# Patient Record
Sex: Female | Born: 1960 | Race: Black or African American | Hispanic: No | Marital: Single | Smoking: Current every day smoker
Health system: Southern US, Community
[De-identification: ages and names within clinical notes are randomized; demographics above are authoritative.]

## PROBLEM LIST (undated history)

## (undated) DIAGNOSIS — M1990 Unspecified osteoarthritis, unspecified site: Secondary | ICD-10-CM

## (undated) DIAGNOSIS — F329 Major depressive disorder, single episode, unspecified: Secondary | ICD-10-CM

## (undated) DIAGNOSIS — F32A Depression, unspecified: Secondary | ICD-10-CM

## (undated) DIAGNOSIS — I2510 Atherosclerotic heart disease of native coronary artery without angina pectoris: Secondary | ICD-10-CM

## (undated) DIAGNOSIS — J45909 Unspecified asthma, uncomplicated: Secondary | ICD-10-CM

## (undated) DIAGNOSIS — K759 Inflammatory liver disease, unspecified: Secondary | ICD-10-CM

## (undated) DIAGNOSIS — I219 Acute myocardial infarction, unspecified: Secondary | ICD-10-CM

## (undated) DIAGNOSIS — E559 Vitamin D deficiency, unspecified: Secondary | ICD-10-CM

## (undated) HISTORY — DX: Inflammatory liver disease, unspecified: K75.9

## (undated) HISTORY — DX: Atherosclerotic heart disease of native coronary artery without angina pectoris: I25.10

## (undated) HISTORY — PX: KNEE SURGERY: SHX244

## (undated) HISTORY — PX: ANKLE SURGERY: SHX546

## (undated) HISTORY — DX: Unspecified asthma, uncomplicated: J45.909

## (undated) HISTORY — DX: Acute myocardial infarction, unspecified: I21.9

## (undated) HISTORY — DX: Vitamin D deficiency, unspecified: E55.9

## (undated) HISTORY — DX: Unspecified osteoarthritis, unspecified site: M19.90

## (undated) HISTORY — PX: TUBAL LIGATION: SHX77

---
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
## 2004-07-21 ENCOUNTER — Emergency Department: Payer: Self-pay | Admitting: Emergency Medicine

## 2004-10-20 ENCOUNTER — Emergency Department: Payer: Self-pay | Admitting: Emergency Medicine

## 2004-12-04 ENCOUNTER — Other Ambulatory Visit: Payer: Self-pay

## 2004-12-04 ENCOUNTER — Emergency Department: Payer: Self-pay | Admitting: Emergency Medicine

## 2005-04-30 ENCOUNTER — Emergency Department: Payer: Self-pay | Admitting: General Practice

## 2007-01-31 ENCOUNTER — Emergency Department: Payer: Self-pay | Admitting: Emergency Medicine

## 2007-02-05 ENCOUNTER — Inpatient Hospital Stay: Payer: Self-pay | Admitting: Internal Medicine

## 2007-02-05 ENCOUNTER — Other Ambulatory Visit: Payer: Self-pay

## 2009-06-18 ENCOUNTER — Emergency Department: Payer: Self-pay | Admitting: Internal Medicine

## 2009-10-16 ENCOUNTER — Inpatient Hospital Stay: Payer: Self-pay | Admitting: Specialist

## 2009-10-21 ENCOUNTER — Emergency Department: Payer: Self-pay | Admitting: Internal Medicine

## 2011-01-06 ENCOUNTER — Emergency Department: Payer: Self-pay | Admitting: Internal Medicine

## 2011-11-09 ENCOUNTER — Emergency Department: Payer: Self-pay | Admitting: Emergency Medicine

## 2012-03-20 ENCOUNTER — Emergency Department: Payer: Self-pay | Admitting: Emergency Medicine

## 2012-03-20 LAB — COMPREHENSIVE METABOLIC PANEL
Anion Gap: 12 (ref 7–16)
Bilirubin,Total: 0.5 mg/dL (ref 0.2–1.0)
Chloride: 109 mmol/L — ABNORMAL HIGH (ref 98–107)
Co2: 22 mmol/L (ref 21–32)
Creatinine: 0.99 mg/dL (ref 0.60–1.30)
EGFR (Non-African Amer.): >60
Glucose: 79 mg/dL (ref 65–99)
Osmolality: 284 (ref 275–301)
Potassium: 4.2 mmol/L (ref 3.5–5.1)
SGOT(AST): 51 U/L — ABNORMAL HIGH (ref 15–37)
SGPT (ALT): 51 U/L
Sodium: 143 mmol/L (ref 136–145)
Total Protein: 8.6 g/dL — ABNORMAL HIGH (ref 6.4–8.2)

## 2012-03-20 LAB — CBC WITH DIFFERENTIAL/PLATELET
Bands: 1 %
Comment - H1-Com1: NORMAL
Comment - H1-Com2: NORMAL
HCT: 43.8 % (ref 35.0–47.0)
HGB: 14.6 g/dL (ref 12.0–16.0)
MCH: 32.6 pg (ref 26.0–34.0)
MCHC: 33.3 g/dL (ref 32.0–36.0)
Platelet: 203 10*3/uL (ref 150–440)
RDW: 13.4 % (ref 11.5–14.5)
WBC: 5.9 10*3/uL (ref 3.6–11.0)

## 2012-03-20 LAB — TROPONIN I: Troponin-I: <0.02 ng/mL

## 2012-07-08 ENCOUNTER — Emergency Department: Payer: Self-pay | Admitting: Emergency Medicine

## 2013-01-23 ENCOUNTER — Emergency Department: Payer: Self-pay | Admitting: Emergency Medicine

## 2013-02-02 ENCOUNTER — Emergency Department: Payer: Self-pay | Admitting: Emergency Medicine

## 2013-07-05 ENCOUNTER — Emergency Department: Payer: Self-pay | Admitting: Internal Medicine

## 2014-03-09 ENCOUNTER — Emergency Department: Payer: Self-pay | Admitting: Emergency Medicine

## 2014-03-09 LAB — COMPREHENSIVE METABOLIC PANEL
ALT: 44 U/L (ref 12–78)
Albumin: 3.6 g/dL (ref 3.4–5.0)
Alkaline Phosphatase: 98 U/L
Anion Gap: 3 — ABNORMAL LOW (ref 7–16)
BILIRUBIN TOTAL: 0.4 mg/dL (ref 0.2–1.0)
BUN: 10 mg/dL (ref 7–18)
CHLORIDE: 110 mmol/L — AB (ref 98–107)
CO2: 26 mmol/L (ref 21–32)
Calcium, Total: 8.9 mg/dL (ref 8.5–10.1)
Creatinine: 0.84 mg/dL (ref 0.60–1.30)
Glucose: 91 mg/dL (ref 65–99)
Osmolality: 276 (ref 275–301)
Potassium: 4 mmol/L (ref 3.5–5.1)
SGOT(AST): 46 U/L — ABNORMAL HIGH (ref 15–37)
Sodium: 139 mmol/L (ref 136–145)
TOTAL PROTEIN: 7.8 g/dL (ref 6.4–8.2)

## 2014-03-09 LAB — CBC
HCT: 42.7 % (ref 35.0–47.0)
HGB: 14.4 g/dL (ref 12.0–16.0)
MCH: 32.8 pg (ref 26.0–34.0)
MCHC: 33.6 g/dL (ref 32.0–36.0)
MCV: 98 fL (ref 80–100)
Platelet: 189 10*3/uL (ref 150–440)
RBC: 4.37 10*6/uL (ref 3.80–5.20)
RDW: 13.1 % (ref 11.5–14.5)
WBC: 5.8 10*3/uL (ref 3.6–11.0)

## 2014-03-09 LAB — TROPONIN I: Troponin-I: <0.02 ng/mL

## 2014-10-08 ENCOUNTER — Emergency Department: Payer: Self-pay | Admitting: Emergency Medicine

## 2014-11-13 ENCOUNTER — Emergency Department: Payer: Self-pay | Admitting: Emergency Medicine

## 2014-11-23 ENCOUNTER — Ambulatory Visit: Payer: Self-pay | Admitting: Family Medicine

## 2014-11-24 ENCOUNTER — Encounter (INDEPENDENT_AMBULATORY_CARE_PROVIDER_SITE_OTHER): Payer: Self-pay

## 2014-11-24 ENCOUNTER — Encounter: Payer: Self-pay | Admitting: Cardiovascular Disease

## 2014-11-24 ENCOUNTER — Ambulatory Visit (INDEPENDENT_AMBULATORY_CARE_PROVIDER_SITE_OTHER): Payer: Medicaid Other | Admitting: Cardiovascular Disease

## 2014-11-24 VITALS — BP 104/62 | HR 76 | Ht 66.0 in | Wt 135.8 lb

## 2014-11-24 DIAGNOSIS — Z720 Tobacco use: Secondary | ICD-10-CM

## 2014-11-24 DIAGNOSIS — F1910 Other psychoactive substance abuse, uncomplicated: Secondary | ICD-10-CM

## 2014-11-24 DIAGNOSIS — R0602 Shortness of breath: Secondary | ICD-10-CM

## 2014-11-24 DIAGNOSIS — F1911 Other psychoactive substance abuse, in remission: Secondary | ICD-10-CM | POA: Insufficient documentation

## 2014-11-24 DIAGNOSIS — R600 Localized edema: Secondary | ICD-10-CM

## 2014-11-24 DIAGNOSIS — J4540 Moderate persistent asthma, uncomplicated: Secondary | ICD-10-CM | POA: Insufficient documentation

## 2014-11-24 DIAGNOSIS — F17200 Nicotine dependence, unspecified, uncomplicated: Secondary | ICD-10-CM | POA: Insufficient documentation

## 2014-11-24 NOTE — Assessment & Plan Note (Signed)
Chronic asthma, dependent on inhalers. Likely exacerbated by her smoking

## 2014-11-24 NOTE — Assessment & Plan Note (Signed)
She reports having some ankle swelling at the end of the day. Minimal swelling noted on today's visit. Good extremity pulses

## 2014-11-24 NOTE — Progress Notes (Signed)
Patient ID: Connie Moreno Moreno, female    DOB: 05/04/1961, 54 y.o.   MRN: [id number]  HPI Comments: Connie Moreno Moreno is a very pleasant 54 year old woman with history of asthma, long smoking history from age 54 who continues to smoke one half pack per day, chronic nerve pain in her legs, bilateral osteoarthritis, recent cortisone shot to her right knee with improvement of her symptoms, who presents for evaluation of shortness of breath, arm pain. Notes indicate a history of prior cocaine use when she was 22, possible period of incarceration at that time.  In follow-up today, she reports that her breathing seems to come and go. When she has asthma attack, she takes 4 puffs of her inhaler.  She denies any significant chest pain. Occasionally has right arm pain. She states that she is going to quit smoking on February 22, in 2 weeks' time, when she follows up with primary care. She cannot quit now as she recently bought a pack of cigarettes. She is concerned about flow in her legs as she continues to have sharp pain down her leg to her feet. No claudication type pain She is concerned about lower extremity edema. After being on her feet all day, has some very mild ankle swelling  EKG on today's visit shows normal sinus rhythm with rate 76 bpm, no significant ST or T-wave changes     Allergies no known allergies  Outpatient Encounter Prescriptions as of 11/24/2014  Medication Sig  . albuterol (PROVENTIL HFA;VENTOLIN HFA) 108 (90 BASE) MCG/ACT inhaler Inhale 2 puffs into the lungs every 6 (six) hours as needed.   . beclomethasone (QVAR) 80 MCG/ACT inhaler Inhale 2 puffs into the lungs 2 (two) times daily.   . Cholecalciferol (VITAMIN D) 2000 UNITS CAPS Take 2,000 Units by mouth daily.   . meloxicam (MOBIC) 15 MG tablet Take 15 mg by mouth daily.  . sertraline (ZOLOFT) 50 MG tablet Take 50 mg by mouth daily.   . traMADol (ULTRAM) 50 MG tablet Take 50 mg by mouth every 6 (six) hours as needed.  .  [DISCONTINUED] meloxicam (MOBIC) 15 MG tablet Take by mouth.    Past Medical History  Diagnosis Date  . MI (myocardial infarction)     age in her mid 4120's  . Asthma   . Coronary artery disease   . Hepatitis   . Arthritis   . Vitamin D deficiency     Past Surgical History  Procedure Laterality Date  . Ankle surgery      metal plate and screws in right ankle.   . Knee surgery      bilateral knee surgery     Social History  reports that she has been smoking Cigarettes.  She has a 19.5 pack-year smoking history. She does not have any smokeless tobacco history on file. She reports that she drinks alcohol. She reports that she uses illicit drugs (Marijuana and Cocaine).  Family History family history includes Hypertension in her brother and brother.   Review of Systems  Constitutional: Negative.   Respiratory: Positive for shortness of breath.   Cardiovascular: Negative.   Gastrointestinal: Negative.   Musculoskeletal: Positive for arthralgias.  Skin: Negative.   Neurological: Negative.   Hematological: Negative.   Psychiatric/Behavioral: Negative.   All other systems reviewed and are negative.   BP 104/62 mmHg  Pulse 76  Ht 5\' 6"  (1.676 m)  Wt 135 lb 12 oz (61.576 kg)  BMI 21.92 kg/m2  Physical Exam  Constitutional: She is oriented  to person, place, and time. She appears well-developed and well-nourished.  HENT:  Head: Normocephalic.  Nose: Nose normal.  Mouth/Throat: Oropharynx is clear and moist.  Eyes: Conjunctivae are normal. Pupils are equal, round, and reactive to light.  Neck: Normal range of motion. Neck supple. No JVD present.  Cardiovascular: Normal rate, regular rhythm, S1 normal, S2 normal, normal heart sounds and intact distal pulses.  Exam reveals no gallop and no friction rub.   No murmur heard. Pulmonary/Chest: Effort normal and breath sounds normal. No respiratory distress. She has no wheezes. She has no rales. She exhibits no tenderness.   Abdominal: Soft. Bowel sounds are normal. She exhibits no distension. There is no tenderness.  Musculoskeletal: Normal range of motion. She exhibits no edema or tenderness.  Lymphadenopathy:    She has no cervical adenopathy.  Neurological: She is alert and oriented to person, place, and time. Coordination normal.  Skin: Skin is warm and dry. No rash noted. No erythema.  Psychiatric: She has a normal mood and affect. Her behavior is normal. Judgment and thought content normal.    Assessment and Plan  Nursing note and vitals reviewed.

## 2014-11-24 NOTE — Patient Instructions (Signed)
You are doing well. No medication changes were made.  We will schedule an echocardiogram for shortness of breath, arm pain  Please call us if you have new issues that need to be addressed before your next appt.  Your physician wants you to follow-up in: 12 months.  You will receive a reminder letter in the mail two months in advance. If you don't receive a letter, please call our office to schedule the follow-up appointment.

## 2014-11-24 NOTE — Assessment & Plan Note (Signed)
Prior cocaine per the notes. She denies any recent substance abuse

## 2014-11-24 NOTE — Assessment & Plan Note (Signed)
She continues to smoke one half pack per day.  We have encouraged her to continue to work on weaning her cigarettes and smoking cessation. She will continue to work on this and does not want any assistance with chantix.

## 2014-11-24 NOTE — Assessment & Plan Note (Addendum)
Etiology of her shortness of breath likely from underlying asthma and long history of smoking. Echocardiogram has been ordered given her leg edema. Normal EKG. Discussed prior history with her. No findings on clinical exam or EKG concerning for old MI. Echocardiogram to rule out wall motion abnormality

## 2014-12-12 ENCOUNTER — Other Ambulatory Visit (INDEPENDENT_AMBULATORY_CARE_PROVIDER_SITE_OTHER): Payer: Medicaid Other

## 2014-12-12 ENCOUNTER — Other Ambulatory Visit: Payer: Self-pay

## 2014-12-12 DIAGNOSIS — R600 Localized edema: Secondary | ICD-10-CM

## 2014-12-12 DIAGNOSIS — R0602 Shortness of breath: Secondary | ICD-10-CM

## 2015-01-03 ENCOUNTER — Ambulatory Visit: Payer: Self-pay | Admitting: Family Medicine

## 2015-01-26 ENCOUNTER — Ambulatory Visit
Admit: 2015-01-26 | Disposition: A | Discharge status: Home / Self Care | Payer: Self-pay | Attending: Family Medicine | Admitting: Family Medicine

## 2015-02-15 ENCOUNTER — Other Ambulatory Visit: Payer: Self-pay | Admitting: Urgent Care

## 2015-02-15 ENCOUNTER — Other Ambulatory Visit
Admit: 2015-02-15 | Disposition: A | Discharge status: Home / Self Care | Payer: Self-pay | Attending: Urgent Care | Admitting: Urgent Care

## 2015-02-15 DIAGNOSIS — R1310 Dysphagia, unspecified: Secondary | ICD-10-CM

## 2015-02-15 LAB — ETHANOL

## 2015-02-19 ENCOUNTER — Ambulatory Visit: Admission: RE | Admit: 2015-02-19 | Payer: Medicaid Other | Source: Ambulatory Visit

## 2015-07-21 ENCOUNTER — Encounter: Payer: Self-pay | Admitting: Emergency Medicine

## 2015-07-21 ENCOUNTER — Emergency Department: Payer: Medicaid Other

## 2015-07-21 ENCOUNTER — Emergency Department
Admission: EM | Admit: 2015-07-21 | Discharge: 2015-07-21 | Disposition: A | Discharge status: Home / Self Care | Payer: Medicaid Other | Attending: Emergency Medicine | Admitting: Emergency Medicine

## 2015-07-21 DIAGNOSIS — Z720 Tobacco use: Secondary | ICD-10-CM | POA: Diagnosis not present

## 2015-07-21 DIAGNOSIS — Z79899 Other long term (current) drug therapy: Secondary | ICD-10-CM | POA: Insufficient documentation

## 2015-07-21 DIAGNOSIS — M1711 Unilateral primary osteoarthritis, right knee: Secondary | ICD-10-CM | POA: Insufficient documentation

## 2015-07-21 DIAGNOSIS — M25561 Pain in right knee: Secondary | ICD-10-CM | POA: Diagnosis present

## 2015-07-21 MED ORDER — MELOXICAM 15 MG PO TABS: 15.0000 mg | ORAL_TABLET | Freq: Every day | ORAL | Status: DC

## 2015-07-21 MED ORDER — NAPROXEN 500 MG PO TABS
500.0000 mg | ORAL_TABLET | Freq: Once | ORAL | Status: AC
  Administered 2015-07-21: 500 mg via ORAL

## 2015-07-21 NOTE — ED Provider Notes (Signed)
[facility] Emergency Department Provider Note  ____________________________________________  Time seen: Approximately 9:59 AM  I have reviewed the triage vital signs and the nursing notes.   HISTORY  Chief Complaint Knee Pain    HPI Connie Moreno is a 54 y.o. female presents to the emergency department complaining of the sudden onset of swelling and pain to her right knee. She states that symptoms began last night. She denies any kind of injury including twisting, fall, blunt trauma. She states that she tried using an ice pack last night without relief. The patient was resting comfortably until provider began interview. Patient became very tearful to the point that further interview was not possible.   Past Medical History  Diagnosis Date  . MI (myocardial infarction) [facility])     age in her mid 43's  . Asthma   . Coronary artery disease   . Hepatitis   . Arthritis   . Vitamin D deficiency     Patient Active Problem List   Diagnosis Date Noted  . Smoker 11/24/2014  . SOB (shortness of breath) 11/24/2014  . Asthma, moderate persistent 11/24/2014  . 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 Bilateral leg edema 11/24/2014    Past Surgical History  Procedure Laterality Date  . Ankle surgery      metal plate and screws in right ankle.   . Knee surgery      bilateral knee surgery     Current Outpatient Rx  Name  Route  Sig  Dispense  Refill  . albuterol (PROVENTIL HFA;VENTOLIN HFA) 108 (90 BASE) MCG/ACT inhaler   Inhalation   Inhale 2 puffs into the lungs every 6 (six) hours as needed.          . beclomethasone (QVAR) 80 MCG/ACT inhaler   Inhalation   Inhale 2 puffs into the lungs 2 (two) times daily.          . Cholecalciferol (VITAMIN D) 2000 UNITS CAPS   Oral   Take 2,000 Units by mouth daily.          . meloxicam (MOBIC) 15 MG tablet   Oral   Take 15 mg by mouth daily.         . sertraline (ZOLOFT) 50 MG tablet   Oral   Take 50 mg by mouth daily.          . traMADol (ULTRAM) 50 MG tablet   Oral   Take 50 mg by mouth every 6 (six) hours as needed.           Allergies Review of patient's allergies indicates no known allergies.  Family History  Problem Relation Age of Onset  . Hypertension Brother   . Hypertension Brother     Social History Social History  Substance Use Topics  . Smoking status: Current Every Day Smoker -- 0.50 packs/day for 39 years    Types: Cigarettes  . Smokeless tobacco: None  . Alcohol Use: Yes     Comment: Drinks a 40 oz. daily.     Review of Systems Constitutional: No fever/chills Eyes: No visual changes. ENT: No sore throat. Cardiovascular: Denies chest pain. Respiratory: Denies shortness of breath. Gastrointestinal: No abdominal pain.  No nausea, no vomiting.  No diarrhea.  No constipation. Genitourinary: Negative for dysuria. Musculoskeletal: Negative for back pain. Positive for right knee pain and swelling. Skin: Negative for rash. Neurological: Negative for headaches, focal weakness or numbness.  10-point ROS otherwise negative.  ____________________________________________   PHYSICAL EXAM:  VITAL SIGNS: ED Triage Vitals  Enc Vitals Group     BP 07/21/15 0937 128/105 mmHg     Pulse Rate 07/21/15 0937 84     Resp --      Temp 07/21/15 0937 98.2 F (36.8 C)     Temp Source 07/21/15 0937 Oral     SpO2 07/21/15 0937 99 %     Weight 07/21/15 0937 147 lb (66.679 kg)     Height 07/21/15 0937  (1.651 m)     Head Cir --      Peak Flow --      Pain Score 07/21/15 0928 10     Pain Loc --      Pain Edu? --      Excl. in GC? --     Constitutional: Alert and oriented. Well appearing and in no acute distress. Eyes: Conjunctivae are normal. PERRL. EOMI. Head: Atraumatic. Nose: No congestion/rhinnorhea. Mouth/Throat: Mucous membranes are moist.  Oropharynx non-erythematous. Neck: No stridor.   Hematological/Lymphatic/Immunilogical: No  cervical lymphadenopathy. Cardiovascular: Normal rate, regular rhythm. Grossly normal heart sounds.  Good peripheral circulation. Respiratory: Normal respiratory effort.  No retractions. Lungs CTAB. Gastrointestinal: Soft and nontender. No distention. No abdominal bruits. No CVA tenderness. Musculoskeletal: Physical examination reveals edema to the right knee when compared with the left. No visible ecchymosis, contusion, laceration, or abrasion noted. Exam of right hip and right ankle are without abnormality. Patient endorses tenderness to palpation over all surfaces of the knee. No ballottement palpated. Patient will not allow deep palpation of the joint line or other osseous structures. Patient will not comply with special tests on the knee. Every attempt at a special tests the patient jerked her light away and pointedly refused joint manipulation performed. Neurologic:  Normal speech and language. No gross focal neurologic deficits are appreciated. No gait instability. Skin:  Skin is warm, dry and intact. No rash noted. Psychiatric: Mood and affect are normal. Speech and behavior are normal.  ____________________________________________   LABS (all labs ordered are listed, but only abnormal results are displayed)  Labs Reviewed - No data to display ____________________________________________  EKG   ____________________________________________  RADIOLOGY  Complete right knee x-ray Impression: Osteoarthritic change, most markedly laterally. Small joint effusion. No acute fracture dislocation. The appearance is essentially stable compared to prior study. ____________________________________________   PROCEDURES  Procedure(s) performed: None  Critical Care performed: No  ____________________________________________   INITIAL IMPRESSION / ASSESSMENT AND PLAN / ED COURSE  Pertinent labs & imaging results that were available during my care of the patient were reviewed by me and  considered in my medical decision making (see chart for details).  The patient is a 54 year old female who presents to the emergency department complaining of pain and swelling to her right knee times one day. Initially patient was very upset and crying profusely to the point that information was unable to be obtained during interview. Patient was endorsing tenderness to palpation over the entire aspect of both anterior and posterior right knee. X-ray was obtained which revealed osteoarthritic changes. The x-ray was stable compared to the prior study. X-ray did reveal small effusion but was unable to be palpated from the external surfaces. No ballottement was noted. Patient was a little bit more calm during second interview phase with provider giving her results of her x-ray. At that time patient endorses a history of right knee arthritis which she is being treated by Dr. Deeann Saint on a routine basis. Advised that she is supposed to  have cortisone injections every 3 months with last injection being "5-6 months ago." We'll start patient on meloxicam and gave patient instructions to follow-up with Dr. Hyacinth Meeker should this medication not improve symptoms. Last understanding of diagnosis and treatment plan. She verbalizes compliance with same. ____________________________________________   FINAL CLINICAL IMPRESSION(S) / ED DIAGNOSES  Final diagnoses:  Arthritis of knee, right      Racheal Patches, PA-C 07/22/15 0701  Emily Filbert, MD 07/22/15 1540

## 2015-07-21 NOTE — ED Notes (Signed)
Knee immobilizer applied to pt per PA order.

## 2015-07-21 NOTE — ED Notes (Signed)
MD at bedside. 

## 2015-07-21 NOTE — ED Notes (Signed)
Pt to ed with c/o right knee pain and swelling that started last night, denies injury.  Pt states tried ice pack without relief.

## 2015-07-21 NOTE — Discharge Instructions (Signed)
Arthritis, Nonspecific °Arthritis is inflammation of a joint. This usually means pain, redness, warmth or swelling are present. One or more joints may be involved. There are a number of types of arthritis. Your caregiver may not be able to tell what type of arthritis you have right away. °CAUSES  °The most common cause of arthritis is the wear and tear on the joint (osteoarthritis). This causes damage to the cartilage, which can break down over time. The knees, hips, back and neck are most often affected by this type of arthritis. °Other types of arthritis and common causes of joint pain include: °· Sprains and other injuries near the joint. Sometimes minor sprains and injuries cause pain and swelling that develop hours later. °· Rheumatoid arthritis. This affects hands, feet and knees. It usually affects both sides of your body at the same time. It is often associated with chronic ailments, fever, weight loss and general weakness. °· Crystal arthritis. Gout and pseudo gout can cause occasional acute severe pain, redness and swelling in the foot, ankle, or knee. °· Infectious arthritis. Bacteria can get into a joint through a break in overlying skin. This can cause infection of the joint. Bacteria and viruses can also spread through the blood and affect your joints. °· Drug, infectious and allergy reactions. Sometimes joints can become mildly painful and slightly swollen with these types of illnesses. °SYMPTOMS  °· Pain is the main symptom. °· Your joint or joints can also be red, swollen and warm or hot to the touch. °· You may have a fever with certain types of arthritis, or even feel overall ill. °· The joint with arthritis will hurt with movement. Stiffness is present with some types of arthritis. °DIAGNOSIS  °Your caregiver will suspect arthritis based on your description of your symptoms and on your exam. Testing may be needed to find the type of arthritis: °· Blood and sometimes urine tests. °· X-ray tests  and sometimes CT or MRI scans. °· Removal of fluid from the joint (arthrocentesis) is done to check for bacteria, crystals or other causes. Your caregiver (or a specialist) will numb the area over the joint with a local anesthetic, and use a needle to remove joint fluid for examination. This procedure is only minimally uncomfortable. °· Even with these tests, your caregiver may not be able to tell what kind of arthritis you have. Consultation with a specialist (rheumatologist) may be helpful. °TREATMENT  °Your caregiver will discuss with you treatment specific to your type of arthritis. If the specific type cannot be determined, then the following general recommendations may apply. °Treatment of severe joint pain includes: °· Rest. °· Elevation. °· Anti-inflammatory medication (for example, ibuprofen) may be prescribed. Avoiding activities that cause increased pain. °· Only take over-the-counter or prescription medicines for pain and discomfort as recommended by your caregiver. °· Cold packs over an inflamed joint may be used for 10 to 15 minutes every hour. Hot packs sometimes feel better, but do not use overnight. Do not use hot packs if you are diabetic without your caregiver's permission. °· A cortisone shot into arthritic joints may help reduce pain and swelling. °· Any acute arthritis that gets worse over the next 1 to 2 days needs to be looked at to be sure there is no joint infection. °Long-term arthritis treatment involves modifying activities and lifestyle to reduce joint stress jarring. This can include weight loss. Also, exercise is needed to nourish the joint cartilage and remove waste. This helps keep the muscles   around the joint strong. HOME CARE INSTRUCTIONS   Do not take aspirin to relieve pain if gout is suspected. This elevates uric acid levels.  Only take over-the-counter or prescription medicines for pain, discomfort or fever as directed by your caregiver.  Rest the joint as much as  possible.  If your joint is swollen, keep it elevated.  Use crutches if the painful joint is in your leg.  Drinking plenty of fluids may help for certain types of arthritis.  Follow your caregiver's dietary instructions.  Try low-impact exercise such as:  Swimming.  Water aerobics.  Biking.  Walking.  Morning stiffness is often relieved by a warm shower.  Put your joints through regular range-of-motion. SEEK MEDICAL CARE IF:   You do not feel better in 24 hours or are getting worse.  You have side effects to medications, or are not getting better with treatment. SEEK IMMEDIATE MEDICAL CARE IF:   You have a fever.  You develop severe joint pain, swelling or redness.  Many joints are involved and become painful and swollen.  There is severe back pain and/or leg weakness.  You have loss of bowel or bladder control. Document Released: 11/13/2004 Document Revised: 12/29/2011 Document Reviewed: 11/29/2008 [facility] Patient Information 2015 [place], [place]. This information is not intended to replace advice given to you by your health care provider. Make sure you discuss any questions you have with your health care provider.   Take medication as prescribed. Do not use any other NSAIDs on this medicine. Follow-up with her orthopedic doctor, Dr. Hyacinth Meeker, if this medication does not improve her symptoms.

## 2015-11-27 ENCOUNTER — Emergency Department: Payer: Medicaid Other

## 2015-11-27 ENCOUNTER — Emergency Department
Admission: EM | Admit: 2015-11-27 | Discharge: 2015-11-28 | Disposition: A | Discharge status: Home / Self Care | Payer: Medicaid Other | Source: Home / Self Care | Attending: Emergency Medicine | Admitting: Emergency Medicine

## 2015-11-27 ENCOUNTER — Encounter: Payer: Self-pay | Admitting: Emergency Medicine

## 2015-11-27 DIAGNOSIS — F17210 Nicotine dependence, cigarettes, uncomplicated: Secondary | ICD-10-CM | POA: Insufficient documentation

## 2015-11-27 DIAGNOSIS — Z7951 Long term (current) use of inhaled steroids: Secondary | ICD-10-CM | POA: Insufficient documentation

## 2015-11-27 DIAGNOSIS — Y998 Other external cause status: Secondary | ICD-10-CM | POA: Insufficient documentation

## 2015-11-27 DIAGNOSIS — Y9289 Other specified places as the place of occurrence of the external cause: Secondary | ICD-10-CM

## 2015-11-27 DIAGNOSIS — X501XXA Overexertion from prolonged static or awkward postures, initial encounter: Secondary | ICD-10-CM

## 2015-11-27 DIAGNOSIS — Y9389 Activity, other specified: Secondary | ICD-10-CM

## 2015-11-27 DIAGNOSIS — S8991XA Unspecified injury of right lower leg, initial encounter: Secondary | ICD-10-CM | POA: Insufficient documentation

## 2015-11-27 DIAGNOSIS — J45901 Unspecified asthma with (acute) exacerbation: Secondary | ICD-10-CM | POA: Diagnosis not present

## 2015-11-27 DIAGNOSIS — Z79899 Other long term (current) drug therapy: Secondary | ICD-10-CM | POA: Diagnosis not present

## 2015-11-27 DIAGNOSIS — J209 Acute bronchitis, unspecified: Secondary | ICD-10-CM | POA: Insufficient documentation

## 2015-11-27 DIAGNOSIS — R112 Nausea with vomiting, unspecified: Secondary | ICD-10-CM | POA: Diagnosis not present

## 2015-11-27 DIAGNOSIS — R05 Cough: Secondary | ICD-10-CM | POA: Diagnosis present

## 2015-11-27 LAB — CBC
HEMATOCRIT: 44.3 % (ref 35.0–47.0)
Hemoglobin: 14.8 g/dL (ref 12.0–16.0)
MCH: 31.3 pg (ref 26.0–34.0)
MCHC: 33.4 g/dL (ref 32.0–36.0)
MCV: 93.6 fL (ref 80.0–100.0)
PLATELETS: 138 10*3/uL — AB (ref 150–440)
RBC: 4.73 MIL/uL (ref 3.80–5.20)
RDW: 12.9 % (ref 11.5–14.5)
WBC: 5.3 10*3/uL (ref 3.6–11.0)

## 2015-11-27 LAB — BASIC METABOLIC PANEL
Anion gap: 10 (ref 5–15)
BUN: 16 mg/dL (ref 6–20)
CO2: 23 mmol/L (ref 22–32)
Calcium: 9.2 mg/dL (ref 8.9–10.3)
Chloride: 101 mmol/L (ref 101–111)
Creatinine, Ser: 0.99 mg/dL (ref 0.44–1.00)
Glucose, Bld: 98 mg/dL (ref 65–99)
POTASSIUM: 4 mmol/L (ref 3.5–5.1)
SODIUM: 134 mmol/L — AB (ref 135–145)

## 2015-11-27 LAB — RAPID INFLUENZA A&B ANTIGENS (ARMC ONLY)
INFLUENZA A (ARMC): NOT DETECTED
INFLUENZA B (ARMC): NOT DETECTED

## 2015-11-27 LAB — POCT RAPID STREP A: Streptococcus, Group A Screen (Direct): NEGATIVE

## 2015-11-27 MED ORDER — ACETAMINOPHEN 325 MG PO TABS
650.0000 mg | ORAL_TABLET | Freq: Once | ORAL | Status: AC | PRN
  Administered 2015-11-27: 650 mg via ORAL
  Filled 2015-11-27: qty 2

## 2015-11-27 NOTE — ED Notes (Signed)
Pt presents to ED with cough, fever (101.2 at triage), and left knee knee pain. Pt reports cough started on Thursday last week with yellow/black sputum. Pt states she twisted her left knee today and fell on it.

## 2015-11-27 NOTE — ED Notes (Signed)
Drew one set of blood cultures and sent to lab

## 2015-11-28 ENCOUNTER — Emergency Department
Admission: EM | Admit: 2015-11-28 | Discharge: 2015-11-28 | Disposition: A | Discharge status: Home / Self Care | Payer: Medicaid Other | Attending: Emergency Medicine | Admitting: Emergency Medicine

## 2015-11-28 ENCOUNTER — Encounter: Payer: Self-pay | Admitting: *Deleted

## 2015-11-28 ENCOUNTER — Emergency Department: Payer: Medicaid Other

## 2015-11-28 DIAGNOSIS — F17210 Nicotine dependence, cigarettes, uncomplicated: Secondary | ICD-10-CM | POA: Insufficient documentation

## 2015-11-28 DIAGNOSIS — J45901 Unspecified asthma with (acute) exacerbation: Secondary | ICD-10-CM | POA: Insufficient documentation

## 2015-11-28 DIAGNOSIS — Z7951 Long term (current) use of inhaled steroids: Secondary | ICD-10-CM | POA: Insufficient documentation

## 2015-11-28 DIAGNOSIS — J40 Bronchitis, not specified as acute or chronic: Secondary | ICD-10-CM

## 2015-11-28 DIAGNOSIS — R112 Nausea with vomiting, unspecified: Secondary | ICD-10-CM | POA: Insufficient documentation

## 2015-11-28 DIAGNOSIS — Z79899 Other long term (current) drug therapy: Secondary | ICD-10-CM | POA: Insufficient documentation

## 2015-11-28 LAB — COMPREHENSIVE METABOLIC PANEL
ALBUMIN: 4 g/dL (ref 3.5–5.0)
ALT: 40 U/L (ref 14–54)
AST: 57 U/L — AB (ref 15–41)
Alkaline Phosphatase: 86 U/L (ref 38–126)
Anion gap: 10 (ref 5–15)
BUN: 13 mg/dL (ref 6–20)
CHLORIDE: 102 mmol/L (ref 101–111)
CO2: 22 mmol/L (ref 22–32)
Calcium: 9.1 mg/dL (ref 8.9–10.3)
Creatinine, Ser: 0.85 mg/dL (ref 0.44–1.00)
GFR calc Af Amer: >60 mL/min (ref >60)
GFR calc non Af Amer: >60 mL/min (ref >60)
Glucose, Bld: 94 mg/dL (ref 65–99)
Potassium: 4.1 mmol/L (ref 3.5–5.1)
SODIUM: 134 mmol/L — AB (ref 135–145)
Total Bilirubin: 0.8 mg/dL (ref 0.3–1.2)
Total Protein: 8 g/dL (ref 6.5–8.1)

## 2015-11-28 LAB — CBC
HCT: 44.7 % (ref 35.0–47.0)
Hemoglobin: 15.2 g/dL (ref 12.0–16.0)
MCH: 31.9 pg (ref 26.0–34.0)
MCHC: 34 g/dL (ref 32.0–36.0)
MCV: 93.6 fL (ref 80.0–100.0)
PLATELETS: 143 10*3/uL — AB (ref 150–440)
RBC: 4.78 MIL/uL (ref 3.80–5.20)
RDW: 13.1 % (ref 11.5–14.5)
WBC: 8.4 10*3/uL (ref 3.6–11.0)

## 2015-11-28 LAB — TROPONIN I: Troponin I: <0.03 ng/mL (ref <0.031)

## 2015-11-28 MED ORDER — METOCLOPRAMIDE HCL 10 MG PO TABS: 10.0000 mg | ORAL_TABLET | Freq: Four times a day (QID) | ORAL | Status: DC | PRN

## 2015-11-28 MED ORDER — KETOROLAC TROMETHAMINE 30 MG/ML IJ SOLN
INTRAMUSCULAR | Status: AC
  Administered 2015-11-28: 30 mg via INTRAMUSCULAR
  Filled 2015-11-28: qty 1

## 2015-11-28 MED ORDER — ACETAMINOPHEN 325 MG PO TABS
650.0000 mg | ORAL_TABLET | Freq: Once | ORAL | Status: AC | PRN
  Administered 2015-11-28: 650 mg via ORAL

## 2015-11-28 MED ORDER — SODIUM CHLORIDE 0.9 % IV BOLUS (SEPSIS)
1000.0000 mL | Freq: Once | INTRAVENOUS | Status: AC
  Administered 2015-11-28: 1000 mL via INTRAVENOUS

## 2015-11-28 MED ORDER — METOCLOPRAMIDE HCL 10 MG PO TABS
ORAL_TABLET | ORAL | Status: AC
  Filled 2015-11-28: qty 1

## 2015-11-28 MED ORDER — KETOROLAC TROMETHAMINE 30 MG/ML IJ SOLN
30.0000 mg | Freq: Once | INTRAMUSCULAR | Status: AC
  Administered 2015-11-28: 30 mg via INTRAMUSCULAR

## 2015-11-28 MED ORDER — AZITHROMYCIN 250 MG PO TABS
500.0000 mg | ORAL_TABLET | Freq: Once | ORAL | Status: AC
  Administered 2015-11-28: 500 mg via ORAL

## 2015-11-28 MED ORDER — METOCLOPRAMIDE HCL 10 MG PO TABS
10.0000 mg | ORAL_TABLET | Freq: Once | ORAL | Status: AC
  Administered 2015-11-28: 10 mg via ORAL
  Filled 2015-11-28: qty 1

## 2015-11-28 MED ORDER — ALBUTEROL SULFATE HFA 108 (90 BASE) MCG/ACT IN AERS: 2.0000 | INHALATION_SPRAY | Freq: Four times a day (QID) | RESPIRATORY_TRACT | Status: DC | PRN

## 2015-11-28 MED ORDER — AZITHROMYCIN 250 MG PO TABS
ORAL_TABLET | ORAL | Status: AC
  Filled 2015-11-28: qty 2

## 2015-11-28 MED ORDER — ACETAMINOPHEN 325 MG PO TABS
650.0000 mg | ORAL_TABLET | Freq: Once | ORAL | Status: AC
  Administered 2015-11-28: 650 mg via ORAL
  Filled 2015-11-28: qty 2

## 2015-11-28 MED ORDER — ACETAMINOPHEN-CODEINE #3 300-30 MG PO TABS
ORAL_TABLET | ORAL | Status: AC
  Filled 2015-11-28: qty 1

## 2015-11-28 MED ORDER — PREDNISONE 20 MG PO TABS: 60.0000 mg | ORAL_TABLET | Freq: Every day | ORAL | Status: DC

## 2015-11-28 MED ORDER — IPRATROPIUM-ALBUTEROL 0.5-2.5 (3) MG/3ML IN SOLN
9.0000 mL | Freq: Once | RESPIRATORY_TRACT | Status: AC
  Administered 2015-11-28: 9 mL via RESPIRATORY_TRACT
  Filled 2015-11-28: qty 9

## 2015-11-28 MED ORDER — PREDNISONE 20 MG PO TABS
60.0000 mg | ORAL_TABLET | Freq: Once | ORAL | Status: AC
  Administered 2015-11-28: 60 mg via ORAL
  Filled 2015-11-28: qty 3

## 2015-11-28 NOTE — ED Notes (Signed)
Tried to get pt in wheelchair to go to xray. Pt willing but very weak. Concerned that pt will not be safe to stand to get xray. Pt assisted back to stretcher. HR 140, sats 96%. MD notified.

## 2015-11-28 NOTE — ED Provider Notes (Signed)
[facility] Emergency Department Provider Note  ____________________________________________  Time seen: Approximately 3:45 PM  I have reviewed the triage vital signs and the nursing notes.   HISTORY  Chief Complaint Shortness of Breath; Emesis; and Generalized Body Aches    HPI Connie Moreno is a 55 y.o. female with a history of a myocardial infarction as well as asthma who is presenting today with diffuse chest pain which is worse with cough as well as runny nose, fever and chills. She says that she has also had nausea and vomiting and not moved her bowels in 2 days. She says that her symptoms started last Thursday. They've been worsening since. She was in the emergency department last night and diagnosed with bronchitis and discharged home with Tylenol 3 as well as azithromycin. She has not filled her prescriptions yet. She had a reassuring workup yesterday including a chest x-ray. She is returning today for worsening symptoms including nausea and vomiting which have worsened. She says that she is vomiting every time that she eats. She says the chest pain is sharp. Nonradiating. Possible sick contact of a family member last week.   Past Medical History  Diagnosis Date  . MI (myocardial infarction) [facility])     age in her mid 32's  . Asthma   . Coronary artery disease   . Hepatitis   . Arthritis   . Vitamin D deficiency     Patient Active Problem List   Diagnosis Date Noted  . Smoker 11/24/2014  . SOB (shortness of breath) 11/24/2014  . Asthma, moderate persistent 11/24/2014  . 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 Bilateral leg edema 11/24/2014    Past Surgical History  Procedure Laterality Date  . Ankle surgery      metal plate and screws in right ankle.   . Knee surgery      bilateral knee surgery     Current Outpatient Rx  Name  Route  Sig  Dispense  Refill  . albuterol (PROVENTIL HFA;VENTOLIN HFA) 108 (90 BASE) MCG/ACT inhaler    Inhalation   Inhale 2 puffs into the lungs every 6 (six) hours as needed.          . beclomethasone (QVAR) 80 MCG/ACT inhaler   Inhalation   Inhale 2 puffs into the lungs 2 (two) times daily.          . Cholecalciferol (VITAMIN D) 2000 UNITS CAPS   Oral   Take 2,000 Units by mouth daily.          . sertraline (ZOLOFT) 50 MG tablet   Oral   Take 50 mg by mouth daily.          . traMADol (ULTRAM) 50 MG tablet   Oral   Take 50 mg by mouth every 6 (six) hours as needed.           Allergies Review of patient's allergies indicates no known allergies.  Family History  Problem Relation Age of Onset  . Hypertension Brother   . Hypertension Brother     Social History Social History  Substance Use Topics  . Smoking status: Current Every Day Smoker -- 0.50 packs/day for 39 years    Types: Cigarettes  . Smokeless tobacco: None  . Alcohol Use: Yes     Comment: Drinks a 40 oz. daily.     Review of Systems Constitutional: No fever/chills Eyes: No visual changes. ENT: No sore throat. Cardiovascular: As above  Respiratory: Cough  Gastrointestinal: No  diarrhea.   Genitourinary: Negative for dysuria. Musculoskeletal: Negative for back pain. Skin: Negative for rash. Neurological: Negative for headaches, focal weakness or numbness.  10-point ROS otherwise negative.  ____________________________________________   PHYSICAL EXAM:  VITAL SIGNS: ED Triage Vitals  Enc Vitals Group     BP 11/28/15 1426 97/58 mmHg     Pulse Rate 11/28/15 1426 106     Resp 11/28/15 1426 18     Temp 11/28/15 1426 100 F (37.8 C)     Temp Source 11/28/15 1426 Oral     SpO2 11/28/15 1426 96 %     Weight 11/28/15 1426 160 lb (72.576 kg)     Height 11/28/15 1426  (1.702 m)     Head Cir --      Peak Flow --      Pain Score 11/28/15 1427 9     Pain Loc --      Pain Edu? --      Excl. in GC? --     Constitutional: Alert and oriented. in no acute distress. Eyes: Conjunctivae are  normal. PERRL. EOMI. Head: Atraumatic. Nose:clear rhinorrhea to the bilateral nares  Mouth/Throat: Mucous membranes are moist.  Oropharynx non-erythematous. Neck: No stridor.   Cardiovascular: Normal rate, regular rhythm. Grossly normal heart sounds.  Good peripheral circulation. chest pain is reproducible palpation. Respiratory: Normal respiratory effort.  No retractions.Expiratory cough with wheezing throughout which is greater to the left especially in the upper field.  Gastrointestinal: Soft with mild and diffuse tenderness to palpation No distention. No abdominal bruits. No CVA tenderness. Musculoskeletal: No lower extremity tenderness nor edema.  No joint effusions. Neurologic:  Normal speech and language. No gross focal neurologic deficits are appreciated. No gait instability. Skin:  Skin is warm, dry and intact. No rash noted. Psychiatric: Mood and affect are normal. Speech and behavior are normal.  ____________________________________________   LABS (all labs ordered are listed, but only abnormal results are displayed)  Labs Reviewed  CBC - Abnormal; Notable for the following:    Platelets 143 (*)    All other components within normal limits  COMPREHENSIVE METABOLIC PANEL - Abnormal; Notable for the following:    Sodium 134 (*)    AST 57 (*)    All other components within normal limits  TROPONIN I   ____________________________________________  EKG  ED ECG REPORT I, Arelia Longest, the attending physician, personally viewed and interpreted this ECG.   Date: 11/28/2015  EKG Time: 1609  Rate: 90  Rhythm: normal sinus rhythm  Axis: Normal axis  Intervals:none  ST&T Change: Biphasic T waves in V2. Possibly secondary to lead placement. Similar morphology seen on EKG from February 2016.  ____________________________________________  RADIOLOGY  No acute abnormalities on the chest  x-ray. ____________________________________________   PROCEDURES   ____________________________________________   INITIAL IMPRESSION / ASSESSMENT AND PLAN / ED COURSE  Pertinent labs & imaging results that were available during my care of the patient were reviewed by me and considered in my medical decision making (see chart for details).  ----------------------------------------- 6:58 PM on 11/28/2015 -----------------------------------------  Patient is resting comfortably at this time. After steroids as well as nebs and anti-medics the patient is able to tolerate by mouth. Her heart rate is 82 when I am reexamining her at this time. Her lungs are clear and she is in no respiratory distress. She'll be discharged home. She will fill her prescriptions for Tylenol 3 as well as azithromycin. I'll add prednisone as well as an  albuterol inhaler and Reglan. She'll be discharged home. She'll follow-up with her primary care doctor.  ____________________________________________   FINAL CLINICAL IMPRESSION(S) / ED DIAGNOSES  Bronchitis.     Myrna Blazer, MD 11/28/15 [phone]

## 2015-11-28 NOTE — ED Notes (Signed)
Pt a/o. Chest pain with cough 9/10. Exp wheeze, right side worse than left. sats 99% RA. HR 116.

## 2015-11-28 NOTE — ED Provider Notes (Signed)
[facility] Emergency Department Provider Note  ____________________________________________  Time seen: 1:30 AM  I have reviewed the triage vital signs and the nursing notes.   HISTORY  Chief Complaint Cough; Fever; and Knee Pain      HPI Connie Moreno is a 55 y.o. female presents with subjective fever cough congestion 5 days. Patient states that the cough is productive with yellow sputum. In addition patient also admits to injuring her right knee status post twisting it yesterday. Patient states current pain score 7 out of 10.    Past Medical History  Diagnosis Date  . MI (myocardial infarction) [facility])     age in her mid 7's  . Asthma   . Coronary artery disease   . Hepatitis   . Arthritis   . Vitamin D deficiency     Patient Active Problem List   Diagnosis Date Noted  . Smoker 11/24/2014  . SOB (shortness of breath) 11/24/2014  . Asthma, moderate persistent 11/24/2014  . 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 Bilateral leg edema 11/24/2014    Past Surgical History  Procedure Laterality Date  . Ankle surgery      metal plate and screws in right ankle.   . Knee surgery      bilateral knee surgery     Current Outpatient Rx  Name  Route  Sig  Dispense  Refill  . albuterol (PROVENTIL HFA;VENTOLIN HFA) 108 (90 BASE) MCG/ACT inhaler   Inhalation   Inhale 2 puffs into the lungs every 6 (six) hours as needed.          . beclomethasone (QVAR) 80 MCG/ACT inhaler   Inhalation   Inhale 2 puffs into the lungs 2 (two) times daily.          . Cholecalciferol (VITAMIN D) 2000 UNITS CAPS   Oral   Take 2,000 Units by mouth daily.          . sertraline (ZOLOFT) 50 MG tablet   Oral   Take 50 mg by mouth daily.          . traMADol (ULTRAM) 50 MG tablet   Oral   Take 50 mg by mouth every 6 (six) hours as needed.           Allergies No known drug allergies  Family History  Problem Relation Age of Onset  . Hypertension  Brother   . Hypertension Brother     Social History Social History  Substance Use Topics  . Smoking status: Current Every Day Smoker -- 0.50 packs/day for 39 years    Types: Cigarettes  . Smokeless tobacco: None  . Alcohol Use: Yes     Comment: Drinks a 40 oz. daily.     Review of Systems  Constitutional: Negative for fever. Eyes: Negative for visual changes. ENT: Negative for sore throat. Cardiovascular: Negative for chest pain. Respiratory: Positive for cough,. Gastrointestinal: Negative for abdominal pain, vomiting and diarrhea. Genitourinary: Negative for dysuria. Musculoskeletal: Negative for back pain. Skin: Negative for rash. Neurological: Negative for headaches, focal weakness or numbness.  10-point ROS otherwise negative.  ____________________________________________   PHYSICAL EXAM:  VITAL SIGNS: ED Triage Vitals  Enc Vitals Group     BP 11/27/15 1922 112/73 mmHg     Pulse Rate 11/27/15 1922 111     Resp 11/27/15 1922 19     Temp 11/27/15 1922 101.2 F (38.4 C)     Temp Source 11/27/15 1922 Oral     SpO2 11/27/15  1922 97 %     Weight --      Height --      Head Cir --      Peak Flow --      Pain Score 11/27/15 1923 10     Pain Loc --      Pain Edu? --      Excl. in GC? --     Constitutional: Alert and oriented. Well appearing and in no distress. Eyes: Conjunctivae are normal. PERRL. Normal extraocular movements. ENT   Head: Normocephalic and atraumatic.   Nose: No congestion/rhinnorhea.   Mouth/Throat: Mucous membranes are moist.   Neck: No stridor. Hematological/Lymphatic/Immunilogical: No cervical lymphadenopathy. Cardiovascular: Normal rate, regular rhythm. Normal and symmetric distal pulses are present in all extremities. No murmurs, rubs, or gallops. Respiratory: Normal respiratory effort without tachypnea nor retractions. Breath sounds are clear and equal bilaterally. Left lung basilar rhonchi on  auscultation. Gastrointestinal: Soft and nontender. No distention. There is no CVA tenderness. Genitourinary: deferred Musculoskeletal: Nontender with normal range of motion in all extremities. No joint effusions.  No lower extremity tenderness nor edema. Neurologic:  Normal speech and language. No gross focal neurologic deficits are appreciated. Speech is normal.  Skin:  Skin is warm, dry and intact. No rash noted. Psychiatric: Mood and affect are normal. Speech and behavior are normal. Patient exhibits appropriate insight and judgment.  ____________________________________________    LABS (pertinent positives/negatives)  Labs Reviewed  BASIC METABOLIC PANEL - Abnormal; Notable for the following:    Sodium 134 (*)    All other components within normal limits  CBC - Abnormal; Notable for the following:    Platelets 138 (*)    All other components within normal limits  RAPID INFLUENZA A&B ANTIGENS ([facility] ONLY)  CULTURE, GROUP A STREP [facility])  POCT RAPID STREP A       RADIOLOGY  DG Chest 2 View (Final result) Result time: 11/27/15 20:05:23   Final result by Rad Results In Interface (11/27/15 20:05:23)   Narrative:   CLINICAL DATA: Patient with cough, fever and right knee pain. Centralized chest pain.  EXAM: CHEST 2 VIEW  COMPARISON: Chest radiograph 03/09/2014.  FINDINGS: Normal cardiac and mediastinal contours. No consolidative pulmonary opacities. No pleural effusion or pneumothorax. Regional skeleton is unremarkable.  IMPRESSION: No acute cardiopulmonary process.   Electronically Signed By: Annia Belt M.D. On: 11/27/2015 20:05          DG Knee Complete 4 Views Right (Final result) Result time: 11/27/15 20:28:13   Final result by Rad Results In Interface (11/27/15 20:28:13)   Narrative:   CLINICAL DATA: Pain following twisting injury earlier today  EXAM: RIGHT KNEE - COMPLETE 4+ VIEW  COMPARISON: July 21, 2015  FINDINGS: Frontal,  lateral, and bilateral oblique views were obtained. There is no demonstrable fracture or dislocation. There is a fairly small but present joint effusion. There is moderate generalized osteoarthritic change. Narrowing is most marked laterally and in the patellofemoral joint. There is mild sclerosis along the lateral tibial plateau, felt to be secondary to the underlying arthropathy. No erosive change.  IMPRESSION: Osteoarthritic change. Small joint effusion. No acute fracture or dislocation.   Electronically Signed By: Bretta Bang III M.D. On: 11/27/2015 20:28           INITIAL IMPRESSION / ASSESSMENT AND PLAN / ED COURSE  Pertinent labs & imaging results that were available during my care of the patient were reviewed by me and considered in my medical decision making (see chart for  details).  History physical exam consistent with possible bronchitis. External chest revealed no evidence of pneumonia however on auscultation left lung basilar rhonchi noted.  ____________________________________________   FINAL CLINICAL IMPRESSION(S) / ED DIAGNOSES  Final diagnoses:  Acute bronchitis, unspecified organism      Darci Current, MD 11/28/15 [phone]

## 2015-11-28 NOTE — ED Notes (Signed)
Pt states she was seen in ER last night and told she had bronchitis, states it is worse today, states sob, body aches, and that she hasnt been to that bathroom in 2 days

## 2015-11-29 LAB — CULTURE, GROUP A STREP (THRC)

## 2016-01-07 ENCOUNTER — Emergency Department
Admission: EM | Admit: 2016-01-07 | Discharge: 2016-01-07 | Disposition: A | Discharge status: Home / Self Care | Payer: Medicaid Other | Attending: Emergency Medicine | Admitting: Emergency Medicine

## 2016-01-07 ENCOUNTER — Encounter: Payer: Self-pay | Admitting: Emergency Medicine

## 2016-01-07 ENCOUNTER — Emergency Department: Payer: Medicaid Other

## 2016-01-07 DIAGNOSIS — I2510 Atherosclerotic heart disease of native coronary artery without angina pectoris: Secondary | ICD-10-CM | POA: Insufficient documentation

## 2016-01-07 DIAGNOSIS — F17210 Nicotine dependence, cigarettes, uncomplicated: Secondary | ICD-10-CM | POA: Diagnosis not present

## 2016-01-07 DIAGNOSIS — Z79899 Other long term (current) drug therapy: Secondary | ICD-10-CM | POA: Diagnosis not present

## 2016-01-07 DIAGNOSIS — M25561 Pain in right knee: Secondary | ICD-10-CM | POA: Diagnosis present

## 2016-01-07 DIAGNOSIS — M1711 Unilateral primary osteoarthritis, right knee: Secondary | ICD-10-CM | POA: Insufficient documentation

## 2016-01-07 DIAGNOSIS — J45909 Unspecified asthma, uncomplicated: Secondary | ICD-10-CM | POA: Insufficient documentation

## 2016-01-07 DIAGNOSIS — Z7951 Long term (current) use of inhaled steroids: Secondary | ICD-10-CM | POA: Diagnosis not present

## 2016-01-07 DIAGNOSIS — M25461 Effusion, right knee: Secondary | ICD-10-CM

## 2016-01-07 DIAGNOSIS — I213 ST elevation (STEMI) myocardial infarction of unspecified site: Secondary | ICD-10-CM | POA: Insufficient documentation

## 2016-01-07 DIAGNOSIS — M179 Osteoarthritis of knee, unspecified: Secondary | ICD-10-CM | POA: Insufficient documentation

## 2016-01-07 MED ORDER — LIDOCAINE HCL (PF) 1 % IJ SOLN
2.0000 mL | Freq: Once | INTRAMUSCULAR | Status: DC
  Filled 2016-01-07: qty 5

## 2016-01-07 MED ORDER — NAPROXEN 500 MG PO TABS: 500.0000 mg | ORAL_TABLET | Freq: Two times a day (BID) | ORAL | Status: DC

## 2016-01-07 NOTE — Discharge Instructions (Signed)
Knee Arthrocentesis Knee arthrocentesis is a procedure to remove fluid from your knee joint. The knee joint is a closed space that is lined by a membrane (synovial membrane). This membrane makes fluid to lubricate your joint, but sometimes this space fills with blood or other fluids. This can cause pain, swelling, and stiffness in your knee. You may have this procedure to remove fluid from a swollen knee or to get a sample of knee fluid for testing. Testing your knee fluid can help your health care provider to determine the cause of your knee pain or swelling. It can also help your health care provider to diagnose diseases such as gout, arthritis, and infections. Removing excess fluid from your knee may also be done to relieve your symptoms. LET [facility]OUR HEALTH CARE PROVIDER KNOW ABOUT:  Any allergies you have.  All medicines you are taking, including vitamins, herbs, eye drops, creams, and over-the-counter medicines.  Previous problems you or members of your family have had with the use of anesthetics.  Any blood disorders you have.  Previous surgeries you have had.  Any medical conditions you may have. RISKS AND COMPLICATIONS Generally, this is a safe procedure. However, problems may occur, including:  Infection.  Bleeding.  Bruising.  Swelling.  Damage to your knee joint.  An allergic reaction to the numbing medicine. BEFORE THE PROCEDURE  Ask your health care provider about changing or stopping your regular medicines. This is especially important if you are taking diabetes medicines or blood thinners.  Plan to have someone take you home after the procedure. PROCEDURE  You will sit or lie down in a position for your knee to be treated.  Your knee will be cleaned with a germ-killing solution (antiseptic).  You will be given a medicine that numbs the area (local anesthetic). You may feel some stinging.  After your knee becomes numb, a health care provider will insert a long,  thin needle into the side of your knee joint. You may feel some pressure.  Your health care provider will pull back the syringe on the needle to remove fluid. Your health care provider may press on your knee to help remove the fluid.  At the end of the procedure, the needle will be removed.  A bandage (dressing) will be placed over the puncture site. The procedure may vary among health care providers and hospitals. AFTER THE PROCEDURE  Your blood pressure, heart rate, breathing rate, and blood oxygen level will be monitored often until the medicines you were given have worn off.   This information is not intended to replace advice given to you by your health care provider. Make sure you discuss any questions you have with your health care provider.   Document Released: 12/25/2006 Document Revised: 10/27/2014 Document Reviewed: 08/16/2014 Elsevier Interactive Patient Education 2016 Else[employer] Inc.  Knee Effusion Knee effusion means that you have excess fluid in your knee joint. This can cause pain and swelling in your knee. This may make your knee more difficult to bend and move. That is because there is increased pain and pressure in the joint. If there is fluid in your knee, it often means that something is wrong inside your knee, such as severe arthritis, abnormal inflammation, or an infection. Another common cause of knee effusion is an injury to the knee muscles, ligaments, or cartilage. HOME CARE INSTRUCTIONS  Use crutches as directed by your health care provider.  Wear a knee brace as directed by your health care provider.  Apply ice  to the swollen area:  Put ice in a plastic bag.  Place a towel between your skin and the bag.  Leave the ice on for 20 minutes, 2-3 times per day.  Keep your knee raised (elevated) when you are sitting or lying down.  Take medicines only as directed by your health care provider.  Do any rehabilitation or strengthening exercises as directed by  your health care provider.  Rest your knee as directed by your health care provider. You may start doing your normal activities again when your health care provider approves.   Keep all follow-up visits as directed by your health care provider. This is important. SEEK MEDICAL CARE IF:  You have ongoing (persistent) pain in your knee. SEEK IMMEDIATE MEDICAL CARE IF:  You have increased swelling or redness of your knee.  You have severe pain in your knee.  You have a fever.   This information is not intended to replace advice given to you by your health care provider. Make sure you discuss any questions you have with your health care provider.   Document Released: 12/27/2003 Document Revised: 10/27/2014 Document Reviewed: 05/22/2014 Elsevier Interactive Patient Education 2016 Elsevier Inc.  Osteoarthritis Osteoarthritis is a disease that causes soreness and inflammation of a joint. It occurs when the cartilage at the affected joint wears down. Cartilage acts as a cushion, covering the ends of bones where they meet to form a joint. Osteoarthritis is the most common form of arthritis. It often occurs in older people. The joints affected most often by this condition include those in the:  Ends of the fingers.  Thumbs.  Neck.  Lower back.  Knees.  Hips. CAUSES  Over time, the cartilage that covers the ends of bones begins to wear away. This causes bone to rub on bone, producing pain and stiffness in the affected joints.  RISK FACTORS Certain factors can increase your chances of having osteoarthritis, including:  Older age.  Excessive body weight.  Overuse of joints.  Previous joint injury. SIGNS AND SYMPTOMS   Pain, swelling, and stiffness in the joint.  Over time, the joint may lose its normal shape.  Small deposits of bone (osteophytes) may grow on the edges of the joint.  Bits of bone or cartilage can break off and float inside the joint space. This may cause  more pain and damage. DIAGNOSIS  Your health care provider will do a physical exam and ask about your symptoms. Various tests may be ordered, such as:  X-rays of the affected joint.  Blood tests to rule out other types of arthritis. Additional tests may be used to diagnose your condition. TREATMENT  Goals of treatment are to control pain and improve joint function. Treatment plans may include:  A prescribed exercise program that allows for rest and joint relief.  A weight control plan.  Pain relief techniques, such as:  Properly applied heat and cold.  Electric pulses delivered to nerve endings under the skin (transcutaneous electrical nerve stimulation [TENS]).  Massage.  Certain nutritional supplements.  Medicines to control pain, such as:  Acetaminophen.  Nonsteroidal anti-inflammatory drugs (NSAIDs), such as naproxen.  Narcotic or central-acting agents, such as tramadol.  Corticosteroids. These can be given orally or as an injection.  Surgery to reposition the bones and relieve pain (osteotomy) or to remove loose pieces of bone and cartilage. Joint replacement may be needed in advanced states of osteoarthritis. HOME CARE INSTRUCTIONS   Take medicines only as directed by your health care provider.  Maintain a healthy weight. Follow your health care provider's instructions for weight control. This may include dietary instructions.  Exercise as directed. Your health care provider can recommend specific types of exercise. These may include:  Strengthening exercises. These are done to strengthen the muscles that support joints affected by arthritis. They can be performed with weights or with exercise bands to add resistance.  Aerobic activities. These are exercises, such as brisk walking or low-impact aerobics, that get your heart pumping.  Range-of-motion activities. These keep your joints limber.  Balance and agility exercises. These help you maintain daily living  skills.  Rest your affected joints as directed by your health care provider.  Keep all follow-up visits as directed by your health care provider. SEEK MEDICAL CARE IF:   Your skin turns red.  You develop a rash in addition to your joint pain.  You have worsening joint pain.  You have a fever along with joint or muscle aches. SEEK IMMEDIATE MEDICAL CARE IF:  You have a significant loss of weight or appetite.  You have night sweats. FOR MORE INFORMATION   National Institute of Arthritis and Musculoskeletal and Skin Diseases: www.niams.http://www.myers.net/nih.gov  [employer]ging: https://walker.com/www.nia.nih.gov  American College of Rheumatology: www.rheumatology.org   This information is not intended to replace advice given to you by your health care provider. Make sure you discuss any questions you have with your health care provider.   Document Released: 10/06/2005 Document Revised: 10/27/2014 Document Reviewed: 06/13/2013 Elsevier Interactive Patient Education [employer]2016 Elsevier Inc.

## 2016-01-07 NOTE — ED Notes (Signed)
Knee pain x 2 days. States has history of problems with this knee, no new injury.

## 2016-01-07 NOTE — ED Provider Notes (Signed)
CSN: [id number]     Arrival date & time 01/07/16  1756 History   First MD Initiated Contact with Patient 01/07/16 1850     Chief Complaint  Patient presents with  . Knee Pain     (Consider location/radiation/quality/duration/timing/severity/associated sxs/prior Treatment) HPI  55 year old female presents to emergency department for evaluation of acute right knee pain. She has a history of chronic knee pain secondary to osteoarthritis, receives cortisone injections which provided her with minimal relief. Patient states 2 days ago, she developed significant pain and swelling throughout the right knee. No trauma or injury. No warmth or fevers. Pain is moderate to severe, she is unable to flex the knee, has difficulty walking. She has taken naproxen without much relief.  Past Medical History  Diagnosis Date  . MI (myocardial infarction) [facility](HCC)     age in her mid 5720's  . Asthma   . Coronary artery disease   . Hepatitis   . Arthritis   . Vitamin D deficiency    Past Surgical History  Procedure Laterality Date  . Ankle surgery      metal plate and screws in right ankle.   . Knee surgery      bilateral knee surgery    Family History  Problem Relation Age of Onset  . Hypertension Brother   . Hypertension Brother    Social History  Substance Use Topics  . Smoking status: Current Every Day Smoker -- 0.50 packs/day for 39 years    Types: Cigarettes  . Smokeless tobacco: None  . Alcohol Use: Yes     Comment: Drinks a 40 oz. daily.    OB History    Gravida Para Term Preterm AB TAB SAB Ectopic Multiple Living   3              Review of Systems  Constitutional: Negative for fever, chills, activity change and fatigue.  HENT: Negative for congestion, sinus pressure and sore throat.   Eyes: Negative for visual disturbance.  Respiratory: Negative for cough, chest tightness and shortness of breath.   Cardiovascular: Negative for chest pain and leg swelling.  Gastrointestinal: Negative  for nausea, vomiting, abdominal pain and diarrhea.  Genitourinary: Negative for dysuria.  Musculoskeletal: Positive for arthralgias. Negative for gait problem.  Skin: Negative for rash.  Neurological: Negative for weakness, numbness and headaches.  Hematological: Negative for adenopathy.  Psychiatric/Behavioral: Negative for behavioral problems, confusion and agitation.      Allergies  Review of patient's allergies indicates no known allergies.  Home Medications   Prior to Admission medications   Medication Sig Start Date End Date Taking? Authorizing Provider  albuterol (PROVENTIL HFA;VENTOLIN HFA) 108 (90 Base) MCG/ACT inhaler Inhale 2 puffs into the lungs every 6 (six) hours as needed for wheezing or shortness of breath. 11/28/15   Myrna Blazeravid Matthew Schaevitz, MD  beclomethasone (QVAR) 80 MCG/ACT inhaler Inhale 2 puffs into the lungs 2 (two) times daily.  11/08/14   Historical Provider, MD  Cholecalciferol (VITAMIN D) 2000 UNITS CAPS Take 2,000 Units by mouth daily.  11/08/14   Historical Provider, MD  metoCLOPramide (REGLAN) 10 MG tablet Take 1 tablet (10 mg total) by mouth every 6 (six) hours as needed for nausea or vomiting. 11/28/15   Myrna Blazeravid Matthew Schaevitz, MD  naproxen (NAPROSYN) 500 MG tablet Take 1 tablet (500 mg total) by mouth 2 (two) times daily with a meal. 01/07/16   Evon Slackhomas C Jawan Chavarria, PA-C  predniSONE (DELTASONE) 20 MG tablet Take 3 tablets (60 mg total) by  mouth daily with breakfast. 11/28/15   Myrna Blazer, MD  sertraline (ZOLOFT) 50 MG tablet Take 50 mg by mouth daily.  11/08/14   Historical Provider, MD  traMADol (ULTRAM) 50 MG tablet Take 50 mg by mouth every 6 (six) hours as needed.    Historical Provider, MD   BP 132/78 mmHg  Pulse 81  Temp(Src) 98.6 F (37 C) (Oral)  Resp 20  Ht  (1.676 m)  Wt 65.772 kg  BMI 23.41 kg/m2  SpO2 99% Physical Exam  Constitutional: She is oriented to person, place, and time. She appears well-developed and well-nourished. No  distress.  HENT:  Head: Normocephalic and atraumatic.  Mouth/Throat: Oropharynx is clear and moist.  Eyes: EOM are normal. Pupils are equal, round, and reactive to light. Right eye exhibits no discharge. Left eye exhibits no discharge.  Neck: Normal range of motion. Neck supple.  Cardiovascular: Normal rate, regular rhythm and intact distal pulses.   Pulmonary/Chest: Effort normal and breath sounds normal. No respiratory distress. She exhibits no tenderness.  Musculoskeletal:  Examination of the right knee shows the patient has moderate to severe effusion with no warmth erythema. Range of motion is limited, 0-80. She is able to straight leg raise, no defect in the quads or patellar tendon. Knee is stable to valgus and varus stress testing. Patella tracks well the trochlear groove.  Neurological: She is alert and oriented to person, place, and time. She has normal reflexes.  Skin: Skin is warm and dry.  Psychiatric: She has a normal mood and affect. Her behavior is normal. Thought content normal.    ED Course  Procedures (including critical care time) Right knee aspiration: Superior lateral aspect of the right knee was prepped with Betadine and alcohol. 2 cc 1% lidocaine was injected into the superior lateral position underneath the patella. She was then cleansed with Betadine and 18-gauge needle was inserted into the knee joint and 45 cc of serosanguineous fluid was removed from the knee. Patient tolerated procedure well. There is improvement in the pain as well as range of motion. 5 inch Ace wrap was applied to the right knee for compression. Patient's neurovascular intact.  Labs Review Labs Reviewed - No data to display  Imaging Review Dg Knee Ap/lat W/sunrise Right  01/07/2016  CLINICAL DATA:  Pain and swelling in the right knee for 2 days. Previous surgery many years ago. 40 mL of bloody fluid was trauma off prior to imaging. EXAM: RIGHT KNEE 3 VIEWS COMPARISON:  11/27/2015 FINDINGS:  Degenerative changes in the right knee with medial greater than lateral compartment narrowing and osteophyte formation predominantly in the lateral compartment. There is a moderate size right knee effusion. No evidence of acute fracture or dislocation. No focal bone lesion or bone destruction. Calcifications in the soft tissues likely represent phleboliths. The effusion is larger than on the previous study. IMPRESSION: Degenerative changes in the right knee. Moderate size right knee effusion. No acute bony abnormalities. Electronically Signed   By: Burman Nieves M.D.   On: 01/07/2016 19:53   I have personally reviewed and evaluated these images and lab results as part of my medical decision-making.   EKG Interpretation None      MDM   Final diagnoses:  Primary osteoarthritis of right knee  Knee effusion, right    55 year old female with moderate to severe right knee osteoarthritis with right knee effusion. No signs of infection. She will take naproxen 500 mg twice a day when necessary pain. She is  placed into a knee ace awap, Rest ice and elevate. She'll follow-up with orthopedics in 2-3 days if not improved.     Evon Slack, PA-C 01/07/16 2010  Sharyn Creamer, MD 01/08/16 0040

## 2016-03-16 IMAGING — CT CT HEAD WITHOUT CONTRAST
1 series · 16 of 30 positions shown, 20 images · non-contrast
Comparison: 03/20/2012.

CLINICAL DATA: Weakness and numbness on the right side.

EXAM:
CT HEAD WITHOUT CONTRAST
TECHNIQUE: Contiguous axial images were obtained from the base of the skull
through the vertex without intravenous contrast.

[Series 2: head wo · axial · 0.43mm/px · z∈[-178,-52]mm · 16 of 32 slices shown, 20 images]
[im 2/32  brain]
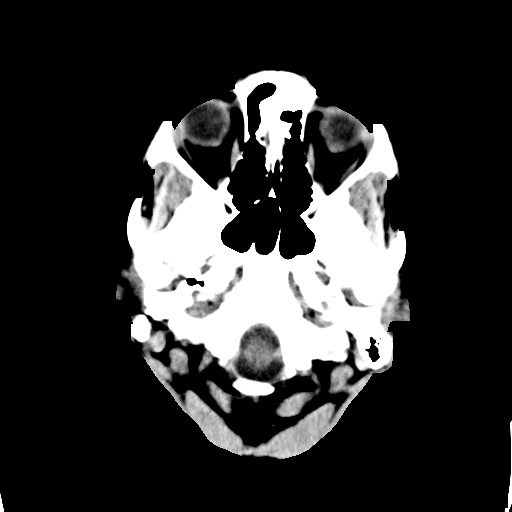
[im 2/32  bone]
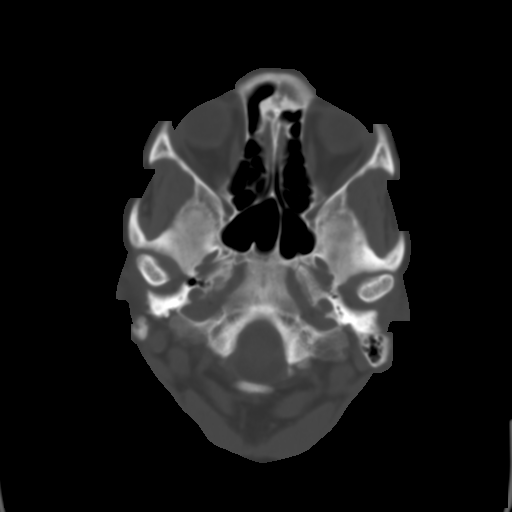
[im 4/32  brain]
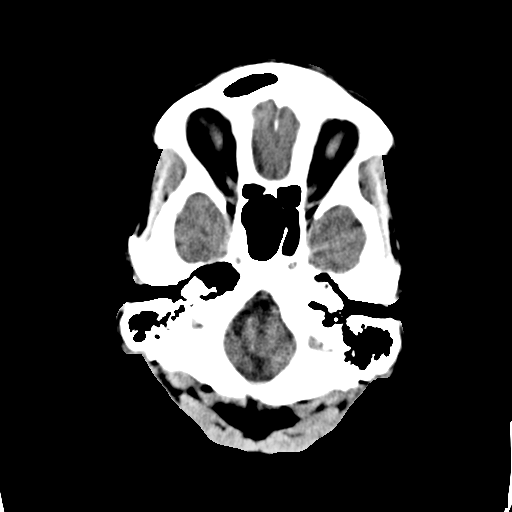
[im 6/32  brain]
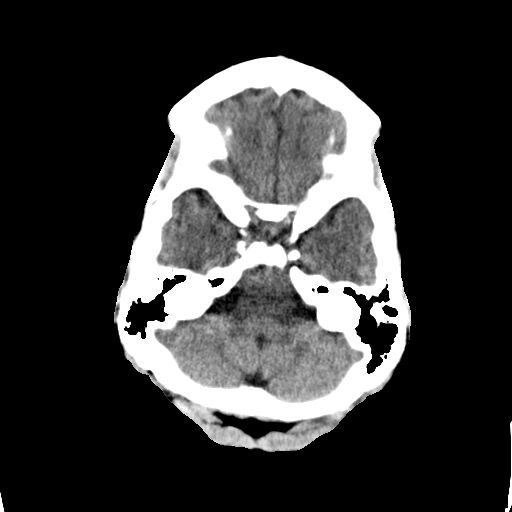
[im 8/32  brain]
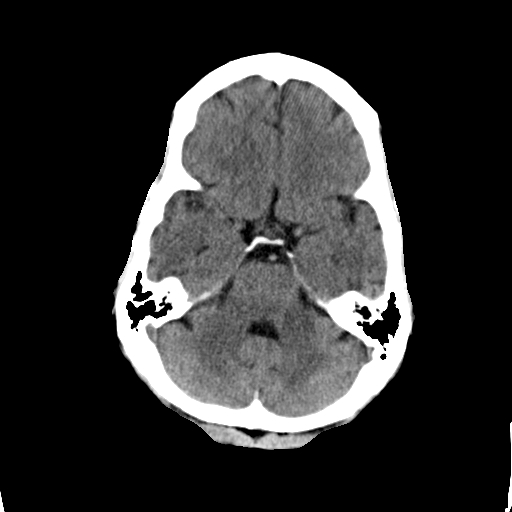
[im 9/32  brain]
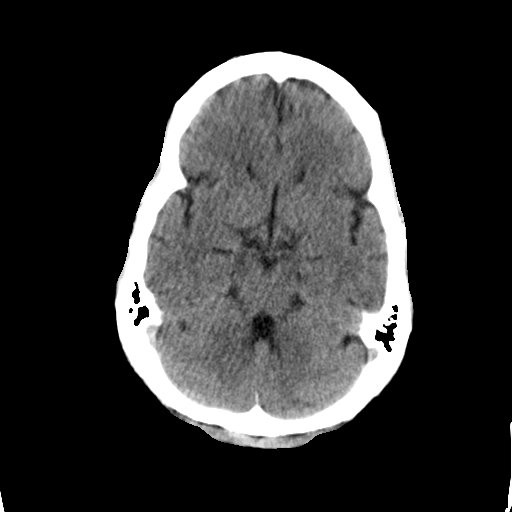
[im 9/32  bone]
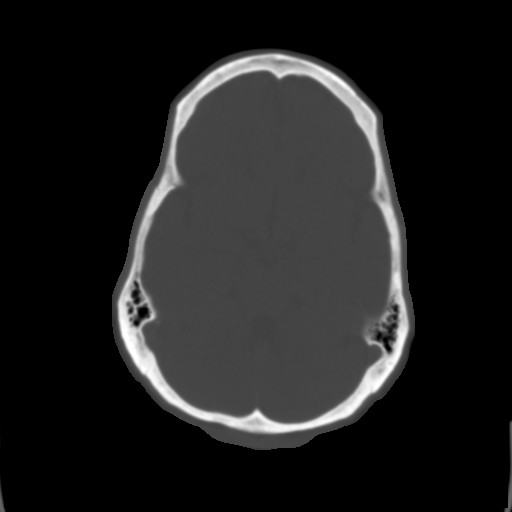
[im 11/32  brain]
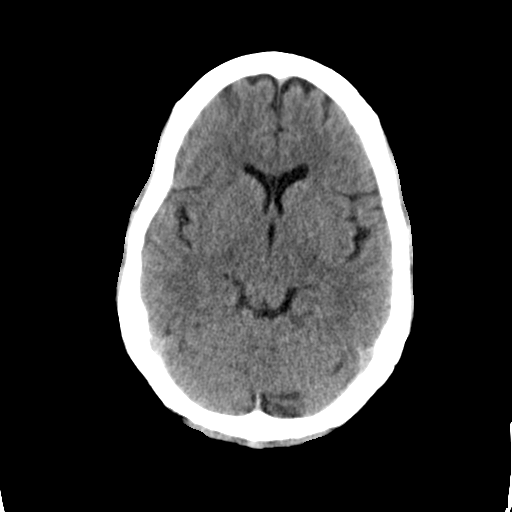
[im 13/32  brain]
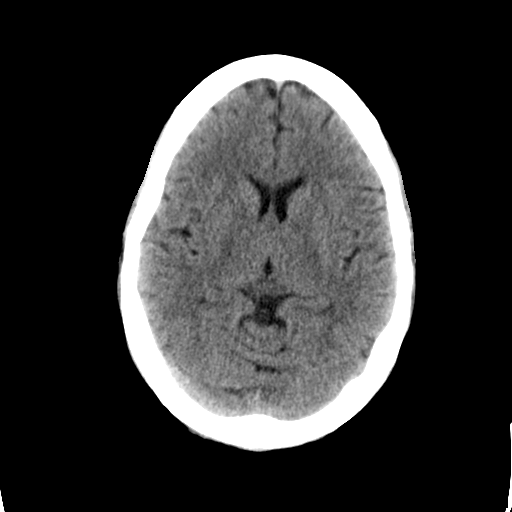
[im 15/32  brain]
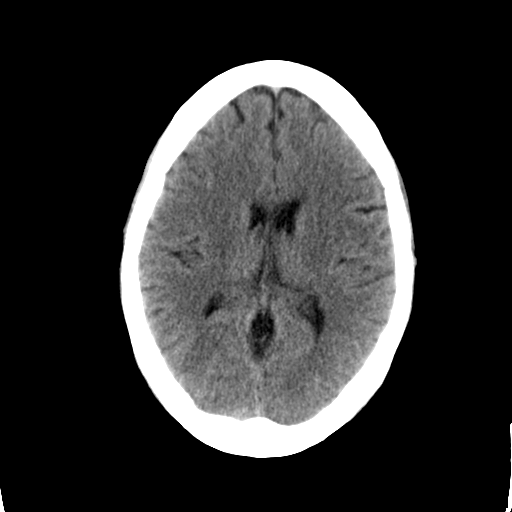
[im 17/32  brain]
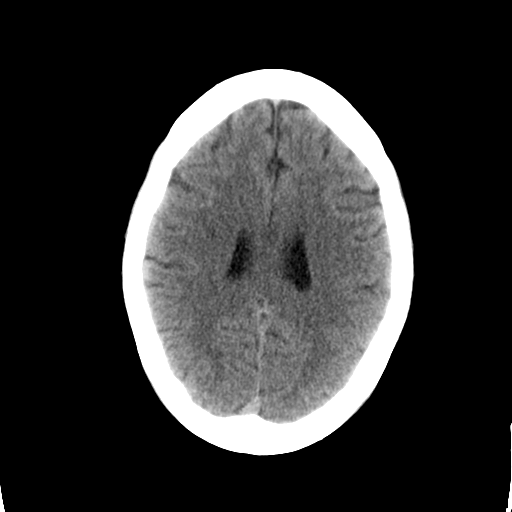
[im 17/32  bone]
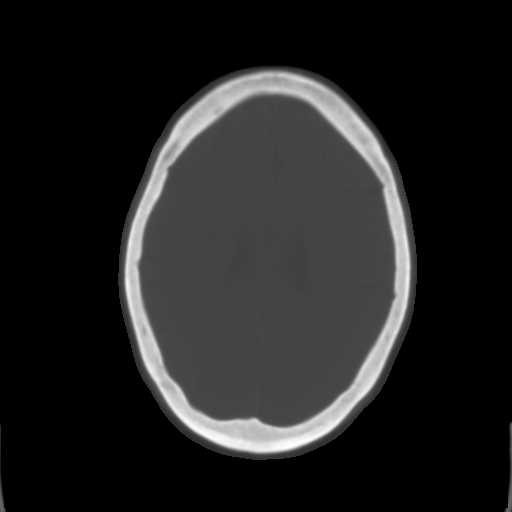
[im 19/32  brain]
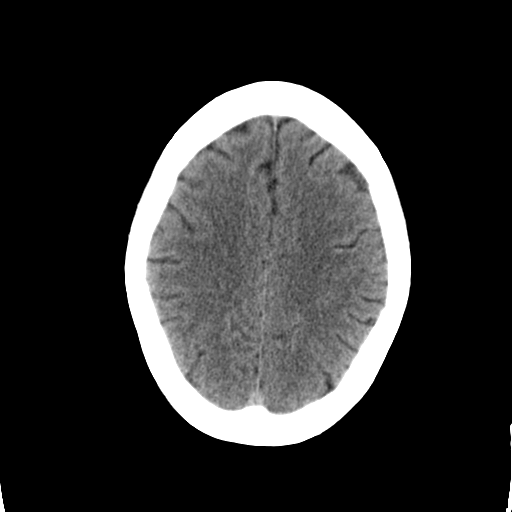
[im 21/32  brain]
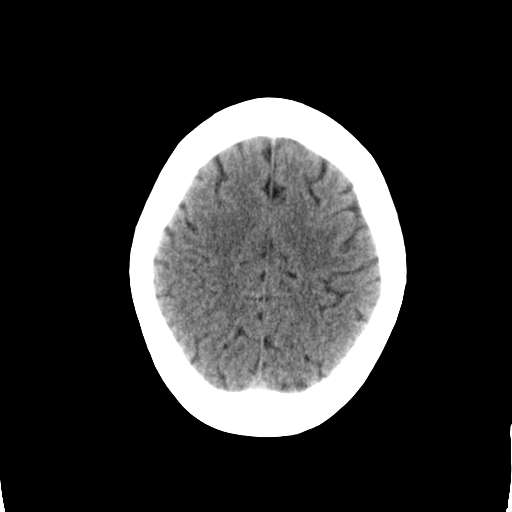
[im 23/32  brain]
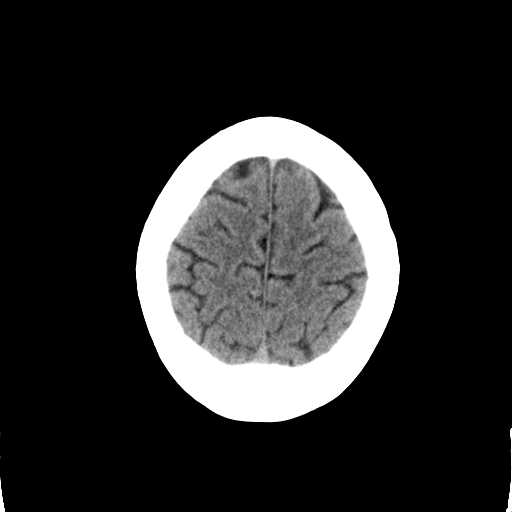
[im 24/32  brain]
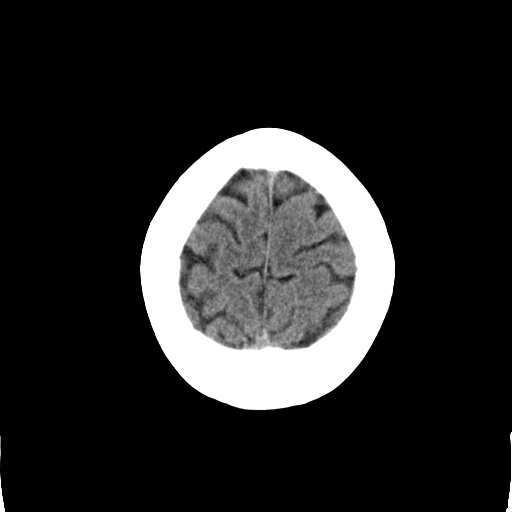
[im 24/32  bone]
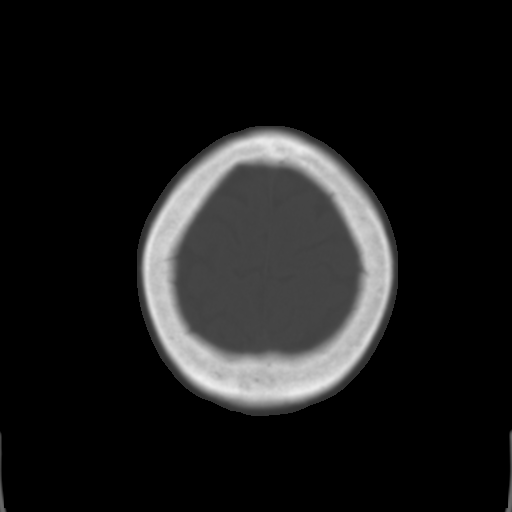
[im 26/32  brain]
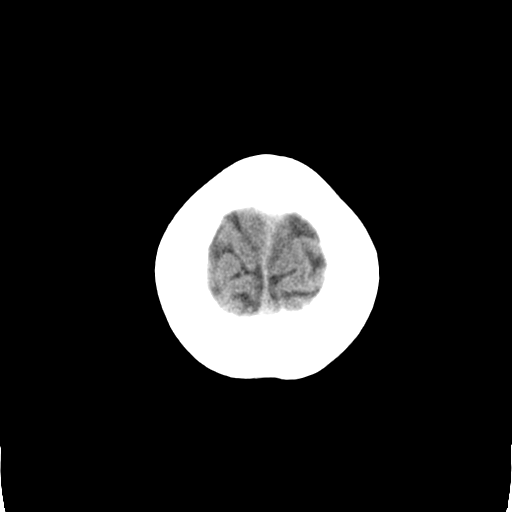
[im 28/32  brain]
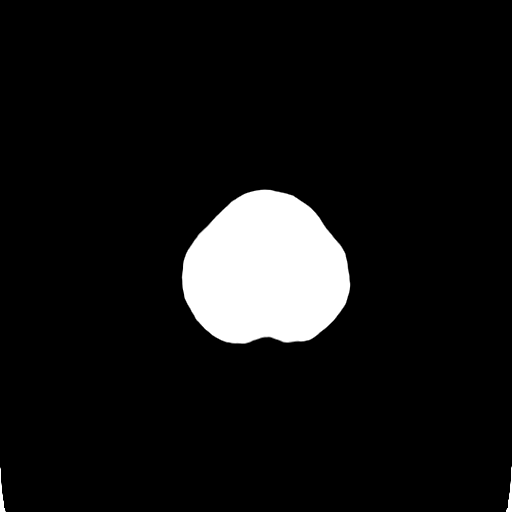
[im 30/32  brain]
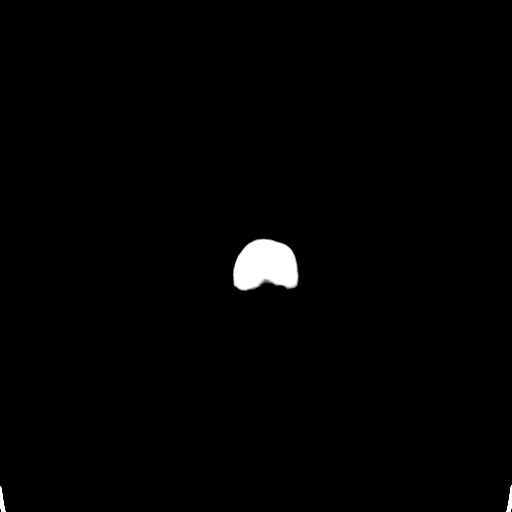

[16 of 30 positions shown; findings below may reference images not displayed]

FINDINGS: The ventricles are normal in size and configuration. No extra-axial
fluid collections are identified. The gray-white differentiation is
normal. No CT findings for acute intracranial process such as
hemorrhage or infarction. No mass lesions. The brainstem and
cerebellum are grossly normal.

The bony structures are intact. The paranasal sinuses and mastoid
air cells are clear. The globes are intact.
IMPRESSION: No acute intracranial findings or mass lesion. No change since prior
CT scan.

## 2016-04-27 ENCOUNTER — Emergency Department
Admission: EM | Admit: 2016-04-27 | Discharge: 2016-04-27 | Disposition: A | Discharge status: Home / Self Care | Payer: Medicaid Other | Attending: Emergency Medicine | Admitting: Emergency Medicine

## 2016-04-27 ENCOUNTER — Encounter: Payer: Self-pay | Admitting: Emergency Medicine

## 2016-04-27 DIAGNOSIS — F17210 Nicotine dependence, cigarettes, uncomplicated: Secondary | ICD-10-CM | POA: Diagnosis not present

## 2016-04-27 DIAGNOSIS — Z79899 Other long term (current) drug therapy: Secondary | ICD-10-CM | POA: Insufficient documentation

## 2016-04-27 DIAGNOSIS — R252 Cramp and spasm: Secondary | ICD-10-CM | POA: Diagnosis present

## 2016-04-27 DIAGNOSIS — I252 Old myocardial infarction: Secondary | ICD-10-CM | POA: Insufficient documentation

## 2016-04-27 DIAGNOSIS — J45909 Unspecified asthma, uncomplicated: Secondary | ICD-10-CM | POA: Insufficient documentation

## 2016-04-27 DIAGNOSIS — M1990 Unspecified osteoarthritis, unspecified site: Secondary | ICD-10-CM | POA: Insufficient documentation

## 2016-04-27 LAB — URINALYSIS COMPLETE WITH MICROSCOPIC (ARMC ONLY)
BILIRUBIN URINE: NEGATIVE
Bacteria, UA: NONE SEEN
Glucose, UA: NEGATIVE mg/dL
HGB URINE DIPSTICK: NEGATIVE
KETONES UR: NEGATIVE mg/dL
Nitrite: NEGATIVE
PH: 6 (ref 5.0–8.0)
PROTEIN: NEGATIVE mg/dL
SPECIFIC GRAVITY, URINE: 1.006 (ref 1.005–1.030)

## 2016-04-27 LAB — CBC WITH DIFFERENTIAL/PLATELET
BASOS ABS: 0.1 10*3/uL (ref 0–0.1)
Basophils Relative: 1 %
EOS PCT: 1 %
Eosinophils Absolute: 0.1 10*3/uL (ref 0–0.7)
HCT: 45.8 % (ref 35.0–47.0)
HEMOGLOBIN: 15.9 g/dL (ref 12.0–16.0)
Lymphocytes Relative: 62 %
Lymphs Abs: 3.9 10*3/uL — ABNORMAL HIGH (ref 1.0–3.6)
MCH: 33.6 pg (ref 26.0–34.0)
MCHC: 34.8 g/dL (ref 32.0–36.0)
MCV: 96.8 fL (ref 80.0–100.0)
MONO ABS: 0.7 10*3/uL (ref 0.2–0.9)
MONOS PCT: 10 %
NEUTROS PCT: 26 %
Neutro Abs: 1.7 10*3/uL (ref 1.4–6.5)
Platelets: 193 10*3/uL (ref 150–440)
RBC: 4.73 MIL/uL (ref 3.80–5.20)
RDW: 13.2 % (ref 11.5–14.5)
WBC: 6.5 10*3/uL (ref 3.6–11.0)

## 2016-04-27 LAB — COMPREHENSIVE METABOLIC PANEL
ALK PHOS: 133 U/L — AB (ref 38–126)
ALT: 38 U/L (ref 14–54)
ANION GAP: 10 (ref 5–15)
AST: 35 U/L (ref 15–41)
Albumin: 4.2 g/dL (ref 3.5–5.0)
BILIRUBIN TOTAL: 0.4 mg/dL (ref 0.3–1.2)
BUN: 16 mg/dL (ref 6–20)
CALCIUM: 9.3 mg/dL (ref 8.9–10.3)
CO2: 29 mmol/L (ref 22–32)
CREATININE: 1.35 mg/dL — AB (ref 0.44–1.00)
Chloride: 99 mmol/L — ABNORMAL LOW (ref 101–111)
GFR, EST AFRICAN AMERICAN: 51 mL/min — AB (ref >60)
GFR, EST NON AFRICAN AMERICAN: 44 mL/min — AB (ref >60)
Glucose, Bld: 104 mg/dL — ABNORMAL HIGH (ref 65–99)
Potassium: 3.4 mmol/L — ABNORMAL LOW (ref 3.5–5.1)
SODIUM: 138 mmol/L (ref 135–145)
TOTAL PROTEIN: 7.7 g/dL (ref 6.5–8.1)

## 2016-04-27 LAB — CK: Total CK: 119 U/L (ref 38–234)

## 2016-04-27 LAB — TROPONIN I

## 2016-04-27 LAB — MAGNESIUM: Magnesium: 2.1 mg/dL (ref 1.7–2.4)

## 2016-04-27 MED ORDER — HALOPERIDOL LACTATE 5 MG/ML IJ SOLN
INTRAMUSCULAR | Status: AC
  Administered 2016-04-27: 2.5 mg via INTRAVENOUS
  Filled 2016-04-27: qty 1

## 2016-04-27 MED ORDER — DIAZEPAM 5 MG/ML IJ SOLN
5.0000 mg | Freq: Once | INTRAMUSCULAR | Status: AC
  Administered 2016-04-27: 5 mg via INTRAVENOUS

## 2016-04-27 MED ORDER — DIAZEPAM 5 MG/ML IJ SOLN
INTRAMUSCULAR | Status: AC
  Filled 2016-04-27: qty 2

## 2016-04-27 MED ORDER — CYCLOBENZAPRINE HCL 10 MG PO TABS: 10.0000 mg | ORAL_TABLET | Freq: Three times a day (TID) | ORAL | Status: DC | PRN

## 2016-04-27 MED ORDER — DIAZEPAM 5 MG/ML IJ SOLN
INTRAMUSCULAR | Status: AC
  Administered 2016-04-27: 5 mg via INTRAVENOUS
  Filled 2016-04-27: qty 2

## 2016-04-27 MED ORDER — HALOPERIDOL LACTATE 5 MG/ML IJ SOLN
2.5000 mg | Freq: Once | INTRAMUSCULAR | Status: AC
  Administered 2016-04-27: 2.5 mg via INTRAVENOUS

## 2016-04-27 NOTE — Discharge Instructions (Signed)

## 2016-04-27 NOTE — ED Notes (Signed)
Pt cousin called to check on pt [phone], states pt has been c/o hip and side cramps since Saturday

## 2016-04-27 NOTE — ED Provider Notes (Addendum)
[facility] Emergency Department Provider Note   ____________________________________________  Time seen: Approximately 2:59 PM  I have reviewed the triage vital signs and the nursing notes.   HISTORY  Chief Complaint Spasms  History limited by extreme pain and agitation  HPI Connie Moreno is a 55 y.o. female patient reports onset of pain and right thigh and buttocks. It is difficult to get a good history because the patient is writhing in pain and saying" my hip and "then she gets cramps in both her feet and both her calves as well. Patient reports she's had cramps like this before but never quite as severe. He does not remember what happened to get rid of the cramps last time. Present cramps are severe. Nothing seems to help some.   Past Medical History  Diagnosis Date  . MI (myocardial infarction) [facility])     age in her mid 53's  . Asthma   . Coronary artery disease   . Hepatitis   . Arthritis   . Vitamin D deficiency     Patient Active Problem List   Diagnosis Date Noted  . Smoker 11/24/2014  . SOB (shortness of breath) 11/24/2014  . Asthma, moderate persistent 11/24/2014  . 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 Bilateral leg edema 11/24/2014    Past Surgical History  Procedure Laterality Date  . Ankle surgery      metal plate and screws in right ankle.   . Knee surgery      bilateral knee surgery     Current Outpatient Rx  Name  Route  Sig  Dispense  Refill  . albuterol (PROVENTIL HFA;VENTOLIN HFA) 108 (90 Base) MCG/ACT inhaler   Inhalation   Inhale 2 puffs into the lungs every 6 (six) hours as needed for wheezing or shortness of breath.   1 Inhaler   0   . beclomethasone (QVAR) 80 MCG/ACT inhaler   Inhalation   Inhale 2 puffs into the lungs 2 (two) times daily.          . Cholecalciferol (VITAMIN D) 2000 UNITS CAPS   Oral   Take 2,000 Units by mouth daily.          . metoCLOPramide (REGLAN) 10 MG tablet  Oral   Take 1 tablet (10 mg total) by mouth every 6 (six) hours as needed for nausea or vomiting.   12 tablet   0   . naproxen (NAPROSYN) 500 MG tablet   Oral   Take 1 tablet (500 mg total) by mouth 2 (two) times daily with a meal.   30 tablet   0   . predniSONE (DELTASONE) 20 MG tablet   Oral   Take 3 tablets (60 mg total) by mouth daily with breakfast.   15 tablet   0   . sertraline (ZOLOFT) 50 MG tablet   Oral   Take 50 mg by mouth daily.          . traMADol (ULTRAM) 50 MG tablet   Oral   Take 50 mg by mouth every 6 (six) hours as needed.           Allergies Review of patient's allergies indicates no known allergies.  Family History  Problem Relation Age of Onset  . Hypertension Brother   . Hypertension Brother     Social History Social History  Substance Use Topics  . Smoking status: Current Every Day Smoker -- 0.50 packs/day for 39 years    Types: Cigarettes  .  Smokeless tobacco: None  . Alcohol Use: Yes     Comment: Drinks a 40 oz. daily.     Review of Systems Constitutional: No fever/chills Eyes: No visual changes. ENT: No sore throat. Cardiovascular: Denies chest pain. Respiratory: Denies shortness of breath. Gastrointestinal: No abdominal pain.  No nausea, no vomiting.  No diarrhea.  No constipation. Genitourinary: Negative for dysuria. Musculoskeletal: Negative for back pain. Skin: Negative for rash. Neurological: Negative for headaches, focal weakness or numbness.  10-point ROS otherwise negative.  ____________________________________________   PHYSICAL EXAM:  VITAL SIGNS: ED Triage Vitals  Enc Vitals Group     BP 04/27/16 1331 114/71 mmHg     Pulse Rate 04/27/16 1331 86     Resp 04/27/16 1331 18     Temp 04/27/16 1331 98.3 F (36.8 C)     Temp Source 04/27/16 1331 Oral     SpO2 04/27/16 1331 100 %     Weight --      Height --      Head Cir --      Peak Flow --      Pain Score 04/27/16 1334 8     Pain Loc --      Pain  Edu? --      Excl. in GC? --     Constitutional: Alert and oriented. Playing of severe pain Eyes: Conjunctivae are normal. PERRL. EOMI. Head: Atraumatic. Nose: No congestion/rhinnorhea. Mouth/Throat: Mucous membranes are moist.  Oropharynx non-erythematous. Neck: No stridor.   Cardiovascular: Normal rate, regular rhythm. Grossly normal heart sounds.  Good peripheral circulation. Respiratory: Normal respiratory effort.  No retractions. Lungs CTAB. Gastrointestinal: Soft and nontender. No distention. No abdominal bruits. No CVA tenderness. Musculoskeletal: No lower extremity edema.  No joint effusions. She complains of cramps and buttocks right thigh and both casts in both feet. Patient has a little bit of muscle tenseness in those areas do not feel any firm rigidity like I would expect with severe cramps. However patient is writhing in pain. Neurologic:  Normal speech and language. No gross focal neurologic deficits are appreciated. No gait instability. Skin:  Skin is warm, dry and intact. No rash noted. Psychiatric: Mood and affect are normal. Speech and behavior are normal.  ____________________________________________   LABS (all labs ordered are listed, but only abnormal results are displayed)  Labs Reviewed  URINALYSIS COMPLETEWITH MICROSCOPIC ([facility] ONLY) - Abnormal; Notable for the following:    Color, Urine STRAW (*)    APPearance CLEAR (*)    Leukocytes, UA 2+ (*)    Squamous Epithelial / LPF 0-5 (*)    All other components within normal limits  CBC WITH DIFFERENTIAL/PLATELET  MAGNESIUM  CK  COMPREHENSIVE METABOLIC PANEL  TROPONIN I   ____________________________________________  EKG   ____________________________________________  RADIOLOGY  ____________________________________________   PROCEDURES  To massage patient's cast does not work. Patient complains of increasing pain while in the room. I give the patient is 2-1/2 of Valium IV with no response to give  her another 2-1/2 of Valium IV patient calms down slightly but is still writhing in pain and complaining of pain. I give the patient another 2 and half of Valium IV for total of 7.5 of Valium IV patient is still moaning in pain saying it hurts a lot but not writhing as much. I give the patient 2.5 of that of Haldol IV and patient then goes to sleep. Patient's cramps are not seeming to bother her anymore. Her muscle tone is normal in her arms  and legs do not feel any cramping. Patient has got a good distal pulse in the dorsalis pedis of both feet with Doppler. I will await the results of her blood work to see if there are any electrolyte abnormalities or elevated CK or any similar problems.  Procedures   ____________________________________________   INITIAL IMPRESSION / ASSESSMENT AND PLAN / ED COURSE  Pertinent labs & imaging results that were available during my care of the patient were reviewed by me and considered in my medical decision making (see chart for details).   ____________________________________________   FINAL CLINICAL IMPRESSION(S) / ED DIAGNOSES  Final diagnoses:  Cramp of both lower extremities      NEW MEDICATIONS STARTED DURING THIS VISIT:  New Prescriptions   No medications on file     Note:  This document was prepared using Dragon voice recognition software and may include unintentional dictation errors.    Arnaldo Natal, MD 04/27/16 1517  Patient wakes up and begins complaining of cramps in her feet this time. I give her another Valium 5 IV and she goes back to sleep. Labs are still pending. Dr. Mayford Knife will follow-up.  Arnaldo Natal, MD 04/27/16 [phone]

## 2016-04-27 NOTE — ED Provider Notes (Signed)
Patient is without complaints currently. Workup to this point has been unremarkable. She does have chronic pain in her knee. She is describing spasms. She'll be discharged with Flexeril and referred back to orthopedics  Emily FilbertJonathan E Dorothey Oetken, MD 04/27/16 [phone]

## 2016-04-27 NOTE — ED Notes (Signed)
Strong Pedial pulses found positive through doppler

## 2016-04-27 NOTE — ED Notes (Signed)
Family member has been notified of pt d/c and states they will be here to pick her up.

## 2016-04-27 NOTE — ED Notes (Signed)
Pt to ED via EMS from home for muscle spasmping to RT upper leg into hip. Pt crying and restless in bed. Pt VS stable. Pt has hx of spasms and cramping. Pt A&O

## 2016-05-16 ENCOUNTER — Encounter: Payer: Self-pay | Admitting: Family Medicine

## 2016-05-26 ENCOUNTER — Encounter: Payer: Self-pay | Admitting: Family Medicine

## 2016-05-26 ENCOUNTER — Other Ambulatory Visit: Payer: Self-pay | Admitting: Family Medicine

## 2016-05-26 ENCOUNTER — Ambulatory Visit (INDEPENDENT_AMBULATORY_CARE_PROVIDER_SITE_OTHER): Payer: Medicaid Other | Admitting: Family Medicine

## 2016-05-26 VITALS — BP 131/73 | HR 89 | Temp 98.4°F | Resp 16 | Ht 66.0 in | Wt 137.8 lb

## 2016-05-26 DIAGNOSIS — M5441 Lumbago with sciatica, right side: Secondary | ICD-10-CM

## 2016-05-26 DIAGNOSIS — Z124 Encounter for screening for malignant neoplasm of cervix: Secondary | ICD-10-CM | POA: Diagnosis not present

## 2016-05-26 DIAGNOSIS — Z720 Tobacco use: Secondary | ICD-10-CM

## 2016-05-26 DIAGNOSIS — J4540 Moderate persistent asthma, uncomplicated: Secondary | ICD-10-CM | POA: Diagnosis not present

## 2016-05-26 DIAGNOSIS — G4762 Sleep related leg cramps: Secondary | ICD-10-CM | POA: Insufficient documentation

## 2016-05-26 DIAGNOSIS — Z01419 Encounter for gynecological examination (general) (routine) without abnormal findings: Secondary | ICD-10-CM

## 2016-05-26 DIAGNOSIS — G8929 Other chronic pain: Secondary | ICD-10-CM | POA: Diagnosis not present

## 2016-05-26 DIAGNOSIS — M25562 Pain in left knee: Secondary | ICD-10-CM

## 2016-05-26 DIAGNOSIS — Z1239 Encounter for other screening for malignant neoplasm of breast: Secondary | ICD-10-CM

## 2016-05-26 DIAGNOSIS — E559 Vitamin D deficiency, unspecified: Secondary | ICD-10-CM

## 2016-05-26 DIAGNOSIS — Z0000 Encounter for general adult medical examination without abnormal findings: Secondary | ICD-10-CM

## 2016-05-26 DIAGNOSIS — M25569 Pain in unspecified knee: Secondary | ICD-10-CM

## 2016-05-26 DIAGNOSIS — F17200 Nicotine dependence, unspecified, uncomplicated: Secondary | ICD-10-CM

## 2016-05-26 MED ORDER — BECLOMETHASONE DIPROPIONATE 80 MCG/ACT IN AERS: 2.0000 | INHALATION_SPRAY | Freq: Two times a day (BID) | RESPIRATORY_TRACT | 11 refills | Status: DC

## 2016-05-26 MED ORDER — PREDNISONE 10 MG PO TABS: ORAL_TABLET | ORAL | 0 refills | Status: DC

## 2016-05-26 MED ORDER — NAPROXEN 500 MG PO TABS: 500.0000 mg | ORAL_TABLET | Freq: Two times a day (BID) | ORAL | 0 refills | Status: DC

## 2016-05-26 MED ORDER — NAPROXEN 500 MG PO TABS: 500.0000 mg | ORAL_TABLET | Freq: Two times a day (BID) | ORAL | 1 refills | Status: DC

## 2016-05-26 MED ORDER — ALBUTEROL SULFATE HFA 108 (90 BASE) MCG/ACT IN AERS: 2.0000 | INHALATION_SPRAY | Freq: Four times a day (QID) | RESPIRATORY_TRACT | 0 refills | Status: DC | PRN

## 2016-05-26 MED ORDER — ALBUTEROL SULFATE HFA 108 (90 BASE) MCG/ACT IN AERS: 2.0000 | INHALATION_SPRAY | Freq: Four times a day (QID) | RESPIRATORY_TRACT | 11 refills | Status: DC | PRN

## 2016-05-26 NOTE — Assessment & Plan Note (Signed)
Trial of prednisone today for sciatica. Has known DDD.  Will do MRI 2/2 increasing numbness.  Naproxen BID for pain.  Alarm symptoms reviewed.

## 2016-05-26 NOTE — Assessment & Plan Note (Signed)
Pt encouraged to follow-up with ortho. Pt refused.

## 2016-05-26 NOTE — Assessment & Plan Note (Signed)
Renewed medication today.  Advised smoking cessation.

## 2016-05-26 NOTE — Assessment & Plan Note (Signed)
Check Vitamin D level today.  

## 2016-05-26 NOTE — Assessment & Plan Note (Signed)
-  Advised smoking cessation

## 2016-05-26 NOTE — Patient Instructions (Addendum)
Back pain: We will schedule an MRI of your back.  Please take prednisone to help with your symptoms.  Once you complete the prednisone, you can take Naproxen to help with pain twice daily.  If you develop progressive numbness, weakness, loss of feeling in your pelvis, or loss of bowel/bladder control please seek immediate medical attention.     Health Maintenance, Female Adopting a healthy lifestyle and getting preventive care can go a long way to promote health and wellness. Talk with your health care provider about what schedule of regular examinations is right for you. This is a good chance for you to check in with your provider about disease prevention and staying healthy. In between checkups, there are plenty of things you can do on your own. Experts have done a lot of research about which lifestyle changes and preventive measures are most likely to keep you healthy. Ask your health care provider for more information. WEIGHT AND DIET  Eat a healthy diet  Be sure to include plenty of vegetables, fruits, low-fat dairy products, and lean protein.  Do not eat a lot of foods high in solid fats, added sugars, or salt.  Get regular exercise. This is one of the most important things you can do for your health.  Most adults should exercise for at least 150 minutes each week. The exercise should increase your heart rate and make you sweat (moderate-intensity exercise).  Most adults should also do strengthening exercises at least twice a week. This is in addition to the moderate-intensity exercise.  Maintain a healthy weight  Body mass index (BMI) is a measurement that can be used to identify possible weight problems. It estimates body fat based on height and weight. Your health care provider can help determine your BMI and help you achieve or maintain a healthy weight.  For females 1 years of age and older:   A BMI below 18.5 is considered underweight.  A BMI of 18.5 to 24.9 is  normal.  A BMI of 25 to 29.9 is considered overweight.  A BMI of 30 and above is considered obese.  Watch levels of cholesterol and blood lipids  You should start having your blood tested for lipids and cholesterol at 53 years of age, then have this test every 5 years.  You may need to have your cholesterol levels checked more often if:  Your lipid or cholesterol levels are high.  You are older than 53 years of age.  You are at high risk for heart disease.  CANCER SCREENING   Lung Cancer  Lung cancer screening is recommended for adults 61-63 years old who are at high risk for lung cancer because of a history of smoking.  A yearly low-dose CT scan of the lungs is recommended for people who:  Currently smoke.  Have quit within the past 15 years.  Have at least a 30-pack-year history of smoking. A pack year is smoking an average of one pack of cigarettes a day for 1 year.  Yearly screening should continue until it has been 15 years since you quit.  Yearly screening should stop if you develop a health problem that would prevent you from having lung cancer treatment.  Breast Cancer  Practice breast self-awareness. This means understanding how your breasts normally appear and feel.  It also means doing regular breast self-exams. Let your health care provider know about any changes, no matter how small.  If you are in your 20s or 30s, you should have a  clinical breast exam (CBE) by a health care provider every 1-3 years as part of a regular health exam.  If you are 38 or older, have a CBE every year. Also consider having a breast X-ray (mammogram) every year.  If you have a family history of breast cancer, talk to your health care provider about genetic screening.  If you are at high risk for breast cancer, talk to your health care provider about having an MRI and a mammogram every year.  Breast cancer gene (BRCA) assessment is recommended for women who have family members  with BRCA-related cancers. BRCA-related cancers include:  Breast.  Ovarian.  Tubal.  Peritoneal cancers.  Results of the assessment will determine the need for genetic counseling and BRCA1 and BRCA2 testing. Cervical Cancer Your health care provider may recommend that you be screened regularly for cancer of the pelvic organs (ovaries, uterus, and vagina). This screening involves a pelvic examination, including checking for microscopic changes to the surface of your cervix (Pap test). You may be encouraged to have this screening done every 3 years, beginning at age 28.  For women ages 4-65, health care providers may recommend pelvic exams and Pap testing every 3 years, or they may recommend the Pap and pelvic exam, combined with testing for human papilloma virus (HPV), every 5 years. Some types of HPV increase your risk of cervical cancer. Testing for HPV may also be done on women of any age with unclear Pap test results.  Other health care providers may not recommend any screening for nonpregnant women who are considered low risk for pelvic cancer and who do not have symptoms. Ask your health care provider if a screening pelvic exam is right for you.  If you have had past treatment for cervical cancer or a condition that could lead to cancer, you need Pap tests and screening for cancer for at least 20 years after your treatment. If Pap tests have been discontinued, your risk factors (such as having a new sexual partner) need to be reassessed to determine if screening should resume. Some women have medical problems that increase the chance of getting cervical cancer. In these cases, your health care provider may recommend more frequent screening and Pap tests. Colorectal Cancer  This type of cancer can be detected and often prevented.  Routine colorectal cancer screening usually begins at 54 years of age and continues through 54 years of age.  Your health care provider may recommend  screening at an earlier age if you have risk factors for colon cancer.  Your health care provider may also recommend using home test kits to check for hidden blood in the stool.  A small camera at the end of a tube can be used to examine your colon directly (sigmoidoscopy or colonoscopy). This is done to check for the earliest forms of colorectal cancer.  Routine screening usually begins at age 23.  Direct examination of the colon should be repeated every 5-10 years through 53 years of age. However, you may need to be screened more often if early forms of precancerous polyps or small growths are found. Skin Cancer  Check your skin from head to toe regularly.  Tell your health care provider about any new moles or changes in moles, especially if there is a change in a mole's shape or color.  Also tell your health care provider if you have a mole that is larger than the size of a pencil eraser.  Always use sunscreen. Apply sunscreen liberally  and repeatedly throughout the day.  Protect yourself by wearing long sleeves, pants, a wide-brimmed hat, and sunglasses whenever you are outside. HEART DISEASE, DIABETES, AND HIGH BLOOD PRESSURE   High blood pressure causes heart disease and increases the risk of stroke. High blood pressure is more likely to develop in:  People who have blood pressure in the high end of the normal range (130-139/85-89 mm Hg).  People who are overweight or obese.  People who are African American.  If you are 18-39 years of age, have your blood pressure checked every 3-5 years. If you are 40 years of age or older, have your blood pressure checked every year. You should have your blood pressure measured twice--once when you are at a hospital or clinic, and once when you are not at a hospital or clinic. Record the average of the two measurements. To check your blood pressure when you are not at a hospital or clinic, you can use:  An automated blood pressure machine at a  pharmacy.  A home blood pressure monitor.  If you are between 55 years and 79 years old, ask your health care provider if you should take aspirin to prevent strokes.  Have regular diabetes screenings. This involves taking a blood sample to check your fasting blood sugar level.  If you are at a normal weight and have a low risk for diabetes, have this test once every three years after 53 years of age.  If you are overweight and have a high risk for diabetes, consider being tested at a younger age or more often. PREVENTING INFECTION  Hepatitis B  If you have a higher risk for hepatitis B, you should be screened for this virus. You are considered at high risk for hepatitis B if:  You were born in a country where hepatitis B is common. Ask your health care provider which countries are considered high risk.  Your parents were born in a high-risk country, and you have not been immunized against hepatitis B (hepatitis B vaccine).  You have HIV or AIDS.  You use needles to inject street drugs.  You live with someone who has hepatitis B.  You have had sex with someone who has hepatitis B.  You get hemodialysis treatment.  You take certain medicines for conditions, including cancer, organ transplantation, and autoimmune conditions. Hepatitis C  Blood testing is recommended for:  Everyone born from 1945 through 1965.  Anyone with known risk factors for hepatitis C. Sexually transmitted infections (STIs)  You should be screened for sexually transmitted infections (STIs) including gonorrhea and chlamydia if:  You are sexually active and are younger than 53 years of age.  You are older than 53 years of age and your health care provider tells you that you are at risk for this type of infection.  Your sexual activity has changed since you were last screened and you are at an increased risk for chlamydia or gonorrhea. Ask your health care provider if you are at risk.  If you do not  have HIV, but are at risk, it may be recommended that you take a prescription medicine daily to prevent HIV infection. This is called pre-exposure prophylaxis (PrEP). You are considered at risk if:  You are sexually active and do not regularly use condoms or know the HIV status of your partner(s).  You take drugs by injection.  You are sexually active with a partner who has HIV. Talk with your health care provider about whether you are   at high risk of being infected with HIV. If you choose to begin PrEP, you should first be tested for HIV. You should then be tested every 3 months for as long as you are taking PrEP.  PREGNANCY   If you are premenopausal and you may become pregnant, ask your health care provider about preconception counseling.  If you may become pregnant, take 400 to 800 micrograms (mcg) of folic acid every day.  If you want to prevent pregnancy, talk to your health care provider about birth control (contraception). OSTEOPOROSIS AND MENOPAUSE   Osteoporosis is a disease in which the bones lose minerals and strength with aging. This can result in serious bone fractures. Your risk for osteoporosis can be identified using a bone density scan.  If you are 65 years of age or older, or if you are at risk for osteoporosis and fractures, ask your health care provider if you should be screened.  Ask your health care provider whether you should take a calcium or vitamin D supplement to lower your risk for osteoporosis.  Menopause may have certain physical symptoms and risks.  Hormone replacement therapy may reduce some of these symptoms and risks. Talk to your health care provider about whether hormone replacement therapy is right for you.  HOME CARE INSTRUCTIONS   Schedule regular health, dental, and eye exams.  Stay current with your immunizations.   Do not use any tobacco products including cigarettes, chewing tobacco, or electronic cigarettes.  If you are pregnant, do not  drink alcohol.  If you are breastfeeding, limit how much and how often you drink alcohol.  Limit alcohol intake to no more than 1 drink per day for nonpregnant women. One drink equals 12 ounces of beer, 5 ounces of wine, or 1 ounces of hard liquor.  Do not use street drugs.  Do not share needles.  Ask your health care provider for help if you need support or information about quitting drugs.  Tell your health care provider if you often feel depressed.  Tell your health care provider if you have ever been abused or do not feel safe at home.   This information is not intended to replace advice given to you by your health care provider. Make sure you discuss any questions you have with your health care provider.   Document Released: 04/21/2011 Document Revised: 10/27/2014 Document Reviewed: 09/07/2013 Elsevier Interactive Patient Education 2016 [employer]vier Inc.    

## 2016-05-26 NOTE — Progress Notes (Signed)
Subjective:    Patient ID: Connie Moreno, female    DOB: Jan 15, 1961, 55 y.o.   MRN: [id number]  HPI: Connie Moreno is a 55 y.o. female presenting on 05/26/2016 for Annual Exam   HPI  Pt presents for annual exam. Is having issues with chronic pain in her back and her knees. Was previously seeing orthopedics but reports joint injections are not helping. Hurts worst when it rains. Having bad back pain. Shooting pains down the legs. Numbness down the R leg. Feels like she trips over her feet at times. No saddle anesthesia. No incontinence. Has known lumbar disc disease.  Is out of all of her medications.  LMP 5 years ago. No vaginal bleeding or discharge. Needs pap. Needs mammo.  Asthma is doing well. Down to 6 cigarettes per day.    Past Medical History:  Diagnosis Date  . Arthritis   . Asthma   . Coronary artery disease   . Hepatitis   . MI (myocardial infarction) [facility])    age in her mid 95's  . Vitamin D deficiency     Current Outpatient Prescriptions on File Prior to Visit  Medication Sig  . Cholecalciferol (VITAMIN D) 2000 UNITS CAPS Take 2,000 Units by mouth daily.   . traMADol (ULTRAM) 50 MG tablet Take 50 mg by mouth every 6 (six) hours as needed.   No current facility-administered medications on file prior to visit.     Review of Systems  Constitutional: Negative for chills and fever.  HENT: Negative.   Respiratory: Negative for cough, chest tightness and wheezing.   Cardiovascular: Negative for chest pain and leg swelling.  Gastrointestinal: Negative for abdominal pain, constipation, diarrhea, nausea and vomiting.  Endocrine: Negative.  Negative for cold intolerance, heat intolerance, polydipsia, polyphagia and polyuria.  Genitourinary: Negative for difficulty urinating and dysuria.  Musculoskeletal: Positive for arthralgias and back pain.  Neurological: Positive for numbness (RLE). Negative for dizziness and light-headedness.  Psychiatric/Behavioral:  Negative.    Per HPI unless specifically indicated above     Objective:    BP 131/73 (BP Location: Left Arm, Patient Position: Sitting, Cuff Size: Normal)   Pulse 89   Temp 98.4 F (36.9 C) (Oral)   Resp 16   Ht 5\' 6"  (1.676 m)   Wt 137 lb 12.8 oz (62.5 kg)   BMI 22.24 kg/m   Wt Readings from Last 3 Encounters:  05/26/16 137 lb 12.8 oz (62.5 kg)  01/07/16 145 lb (65.8 kg)  11/28/15 160 lb (72.6 kg)    Physical Exam  Constitutional: She is oriented to person, place, and time. She appears well-developed and well-nourished.  HENT:  Head: Normocephalic and atraumatic.  Neck: Neck supple.  Cardiovascular: Normal rate, regular rhythm and normal heart sounds.  Exam reveals no gallop and no friction rub.   No murmur heard. Pulmonary/Chest: Effort normal and breath sounds normal. She has no wheezes. She exhibits no tenderness.  Abdominal: Soft. Normal appearance and bowel sounds are normal. She exhibits no distension and no mass. There is no tenderness. There is no rebound and no guarding.  Genitourinary: Vagina normal and uterus normal. There is no tenderness, lesion or injury on the right labia. There is no lesion or injury on the left labia. Cervix exhibits no motion tenderness, no discharge and no friability. Right adnexum displays no tenderness and no fullness. Left adnexum displays no tenderness and no fullness.  Musculoskeletal: She exhibits no edema.       Right shoulder: She  exhibits decreased range of motion (2/2 pain.), tenderness, bony tenderness and pain.       Arms: Lymphadenopathy:    She has no cervical adenopathy.  Neurological: She is alert and oriented to person, place, and time. She has normal strength. No cranial nerve deficit or sensory deficit. Gait (antalgic gait. ) abnormal.  Reflex Scores:      Patellar reflexes are 2+ on the right side and 2+ on the left side. +straight leg raise bilateral.   Skin: Skin is warm and dry.   Results for orders placed or  performed during the hospital encounter of 04/27/16  CBC with Differential  Result Value Ref Range   WBC 6.5 3.6 - 11.0 K/uL   RBC 4.73 3.80 - 5.20 MIL/uL   Hemoglobin 15.9 12.0 - 16.0 g/dL   HCT 40.945.8 81.135.0 - 91.447.0 %   MCV 96.8 80.0 - 100.0 fL   MCH 33.6 26.0 - 34.0 pg   MCHC 34.8 32.0 - 36.0 g/dL   RDW 78.213.2 95.611.5 - 21.314.5 %   Platelets 193 150 - 440 K/uL   Neutrophils Relative % 26 %   Lymphocytes Relative 62 %   Monocytes Relative 10 %   Eosinophils Relative 1 %   Basophils Relative 1 %   Neutro Abs 1.7 1.4 - 6.5 K/uL   Lymphs Abs 3.9 (H) 1.0 - 3.6 K/uL   Monocytes Absolute 0.7 0.2 - 0.9 K/uL   Eosinophils Absolute 0.1 0 - 0.7 K/uL   Basophils Absolute 0.1 0 - 0.1 K/uL  Urinalysis complete, with microscopic  Result Value Ref Range   Color, Urine STRAW (A) YELLOW   APPearance CLEAR (A) CLEAR   Glucose, UA NEGATIVE NEGATIVE mg/dL   Bilirubin Urine NEGATIVE NEGATIVE   Ketones, ur NEGATIVE NEGATIVE mg/dL   Specific Gravity, Urine 1.006 1.005 - 1.030   Hgb urine dipstick NEGATIVE NEGATIVE   pH 6.0 5.0 - 8.0   Protein, ur NEGATIVE NEGATIVE mg/dL   Nitrite NEGATIVE NEGATIVE   Leukocytes, UA 2+ (A) NEGATIVE   RBC / HPF 0-5 0 - 5 RBC/hpf   WBC, UA 0-5 0 - 5 WBC/hpf   Bacteria, UA NONE SEEN NONE SEEN   Squamous Epithelial / LPF 0-5 (A) NONE SEEN   Hyaline Casts, UA PRESENT   Magnesium  Result Value Ref Range   Magnesium 2.1 1.7 - 2.4 mg/dL  CK  Result Value Ref Range   Total CK 119 38 - 234 U/L  Comprehensive metabolic panel  Result Value Ref Range   Sodium 138 135 - 145 mmol/L   Potassium 3.4 (L) 3.5 - 5.1 mmol/L   Chloride 99 (L) 101 - 111 mmol/L   CO2 29 22 - 32 mmol/L   Glucose, Bld 104 (H) 65 - 99 mg/dL   BUN 16 6 - 20 mg/dL   Creatinine, Ser 0.861.35 (H) 0.44 - 1.00 mg/dL   Calcium 9.3 8.9 - 57.810.3 mg/dL   Total Protein 7.7 6.5 - 8.1 g/dL   Albumin 4.2 3.5 - 5.0 g/dL   AST 35 15 - 41 U/L   ALT 38 14 - 54 U/L   Alkaline Phosphatase 133 (H) 38 - 126 U/L   Total Bilirubin  0.4 0.3 - 1.2 mg/dL   GFR calc non Af Amer 44 (L) >60 mL/min   GFR calc Af Amer 51 (L) >60 mL/min   Anion gap 10 5 - 15  Troponin I  Result Value Ref Range   Troponin I <0.03 <0.03 ng/mL  Assessment & Plan:   Problem List Items Addressed This Visit      Respiratory   Asthma, moderate persistent    Renewed medication today.  Advised smoking cessation.       Relevant Medications   beclomethasone (QVAR) 80 MCG/ACT inhaler   albuterol (PROVENTIL HFA;VENTOLIN HFA) 108 (90 Base) MCG/ACT inhaler   predniSONE (DELTASONE) 10 MG tablet     Other   Smoker    Advised smoking cessation.       Midline low back pain with right-sided sciatica    Trial of prednisone today for sciatica. Has known DDD.  Will do MRI 2/2 increasing numbness.  Naproxen BID for pain.  Alarm symptoms reviewed.       Relevant Medications   naproxen (NAPROSYN) 500 MG tablet   predniSONE (DELTASONE) 10 MG tablet   Other Relevant Orders   MR Lumbar Spine Wo Contrast   Nocturnal leg cramps   Relevant Orders   TSH   Magnesium   Vitamin D deficiency    Check Vitamin D level today.       Relevant Orders   Vitamin D (25 hydroxy)   Chronic knee pain    Pt encouraged to follow-up with ortho. Pt refused.       Relevant Medications   naproxen (NAPROSYN) 500 MG tablet    Other Visit Diagnoses    Well woman exam    -  Primary   Relevant Orders   BASIC METABOLIC PANEL WITH GFR   Lipid Profile   Pap,SurePath with HPV   Screening for cervical cancer       Relevant Orders   Pap,SurePath with HPV   Screening for breast cancer       Relevant Orders   MM Digital Screening      Meds ordered this encounter  Medications  . DISCONTD: naproxen (NAPROSYN) 500 MG tablet    Sig: Take 1 tablet (500 mg total) by mouth 2 (two) times daily with a meal.    Dispense:  30 tablet    Refill:  0    Order Specific Question:   Supervising Provider    Answer:   Janeann Forehand [phone]  . beclomethasone (QVAR) 80  MCG/ACT inhaler    Sig: Inhale 2 puffs into the lungs 2 (two) times daily.    Dispense:  1 Inhaler    Refill:  11    Order Specific Question:   Supervising Provider    Answer:   Janeann Forehand [phone]  . DISCONTD: albuterol (PROVENTIL HFA;VENTOLIN HFA) 108 (90 Base) MCG/ACT inhaler    Sig: Inhale 2 puffs into the lungs every 6 (six) hours as needed for wheezing or shortness of breath.    Dispense:  1 Inhaler    Refill:  0    Order Specific Question:   Supervising Provider    Answer:   Janeann Forehand [[id number]]  . albuterol (PROVENTIL HFA;VENTOLIN HFA) 108 (90 Base) MCG/ACT inhaler    Sig: Inhale 2 puffs into the lungs every 6 (six) hours as needed for wheezing or shortness of breath.    Dispense:  1 Inhaler    Refill:  11    Order Specific Question:   Supervising Provider    Answer:   Janeann Forehand [phone]  . naproxen (NAPROSYN) 500 MG tablet    Sig: Take 1 tablet (500 mg total) by mouth 2 (two) times daily with a meal.    Dispense:  30 tablet  Refill:  1    Order Specific Question:   Supervising Provider    Answer:   Janeann Forehand [[id number]]  . predniSONE (DELTASONE) 10 MG tablet    Sig: Take 6 pills today, 5 pills day 2, 4 pills day 3, 3 pills day 4, 2 pills day 5, 1 pill day 6. STOP.    Dispense:  21 tablet    Refill:  0    Order Specific Question:   Supervising Provider    Answer:   Janeann Forehand [[id number]]      Follow up plan: Return in about 4 weeks (around 06/23/2016) for back pain. Marland Kitchen

## 2016-05-27 ENCOUNTER — Other Ambulatory Visit: Payer: Self-pay | Admitting: Family Medicine

## 2016-05-27 DIAGNOSIS — E559 Vitamin D deficiency, unspecified: Secondary | ICD-10-CM

## 2016-05-27 LAB — VITAMIN D 25 HYDROXY (VIT D DEFICIENCY, FRACTURES): VIT D 25 HYDROXY: 22 ng/mL — AB (ref 30–100)

## 2016-05-27 LAB — BASIC METABOLIC PANEL WITH GFR
BUN: 19 mg/dL (ref 7–25)
CO2: 27 mmol/L (ref 20–31)
Calcium: 9.4 mg/dL (ref 8.6–10.4)
Chloride: 107 mmol/L (ref 98–110)
Creat: 1.01 mg/dL (ref 0.50–1.05)
GFR, EST AFRICAN AMERICAN: 73 mL/min (ref >=60)
GFR, EST NON AFRICAN AMERICAN: 64 mL/min (ref >=60)
Glucose, Bld: 95 mg/dL (ref 65–99)
POTASSIUM: 4.3 mmol/L (ref 3.5–5.3)
SODIUM: 143 mmol/L (ref 135–146)

## 2016-05-27 LAB — LIPID PANEL
CHOLESTEROL: 186 mg/dL (ref 125–200)
HDL: 66 mg/dL (ref >=46)
LDL Cholesterol: 72 mg/dL (ref <130)
Total CHOL/HDL Ratio: 2.8 Ratio (ref <=5.0)
Triglycerides: 240 mg/dL — ABNORMAL HIGH (ref <150)
VLDL: 48 mg/dL — ABNORMAL HIGH (ref <30)

## 2016-05-27 LAB — TSH: TSH: 0.78 m[IU]/L

## 2016-05-27 LAB — MAGNESIUM: MAGNESIUM: 2.1 mg/dL (ref 1.5–2.5)

## 2016-05-27 MED ORDER — VITAMIN D (ERGOCALCIFEROL) 1.25 MG (50000 UNIT) PO CAPS: 50000.0000 [IU] | ORAL_CAPSULE | ORAL | 1 refills | Status: DC

## 2016-05-28 ENCOUNTER — Telehealth: Payer: Self-pay | Admitting: *Deleted

## 2016-05-28 LAB — PAP,SUREPATH WITH HPV: HPV DNA High Risk: DETECTED — AB

## 2016-05-28 NOTE — Telephone Encounter (Signed)
Patient MRI scheduled: Mon 06/09/16 @ 1pm. Patient needs to [facility] no later than 12:30 [facility].

## 2016-05-29 ENCOUNTER — Other Ambulatory Visit: Payer: Self-pay | Admitting: Family Medicine

## 2016-05-29 DIAGNOSIS — A599 Trichomoniasis, unspecified: Secondary | ICD-10-CM

## 2016-05-29 DIAGNOSIS — R87810 Cervical high risk human papillomavirus (HPV) DNA test positive: Secondary | ICD-10-CM

## 2016-05-29 MED ORDER — METRONIDAZOLE 500 MG PO TABS: 500.0000 mg | ORAL_TABLET | Freq: Two times a day (BID) | ORAL | 0 refills | Status: DC

## 2016-06-04 ENCOUNTER — Telehealth: Payer: Self-pay | Admitting: Family Medicine

## 2016-06-04 NOTE — Telephone Encounter (Signed)
Message left for appeals rep at [phone] to fax a appeals form that must be completed within 5 day. Provider may also call [phone] for peer-to-peer with [facility] physician. This also must be done within 5 days of original notice.

## 2016-06-04 NOTE — Telephone Encounter (Signed)
Called [facility]:  Her MRI was denied by medicaid. We can file an appeal. She needs to call [phone] to request an appeal within 30 days.  Thanks! AK

## 2016-06-05 ENCOUNTER — Telehealth: Payer: Self-pay | Admitting: Family Medicine

## 2016-06-05 NOTE — Telephone Encounter (Signed)
Called and did peer to peer. Will not be covered until 6 weeks after August 7 visit.

## 2016-06-05 NOTE — Telephone Encounter (Signed)
Called patient and notified appt for MRI cancelled.[place]

## 2016-06-10 ENCOUNTER — Ambulatory Visit: Payer: Medicaid Other

## 2016-06-19 ENCOUNTER — Telehealth: Payer: Self-pay | Admitting: Family Medicine

## 2016-06-19 NOTE — Telephone Encounter (Signed)
When pt[facility] [place] she did not have the money to pick them up.  [facility] only [facility] for 7 days.  Her call back number is [phone]

## 2016-06-19 NOTE — Telephone Encounter (Signed)
Called pharmacy and verbal was given.

## 2016-07-01 ENCOUNTER — Ambulatory Visit: Payer: Medicaid Other | Admitting: Family Medicine

## 2016-07-08 ENCOUNTER — Ambulatory Visit: Payer: Medicaid Other | Admitting: Family Medicine

## 2016-08-08 ENCOUNTER — Telehealth: Payer: Self-pay | Admitting: *Deleted

## 2016-08-08 NOTE — Telephone Encounter (Signed)
Patient has GYN appt 07/28/16. Patient ns appt.

## 2017-02-02 IMAGING — CR RIGHT HIP - COMPLETE 2+ VIEW
1 series · 3 of 3 positions shown · non-contrast
Comparison: 07/05/2013.

CLINICAL DATA: 54-year-old female who fell while walking yesterday
onto right hip. Pain. Initial encounter.

EXAM:
RIGHT HIP (WITH PELVIS) 2-3 VIEWS

[Series 1: ap · 0.17mm/px · 3 of 3 slices shown]
[im 1/3]
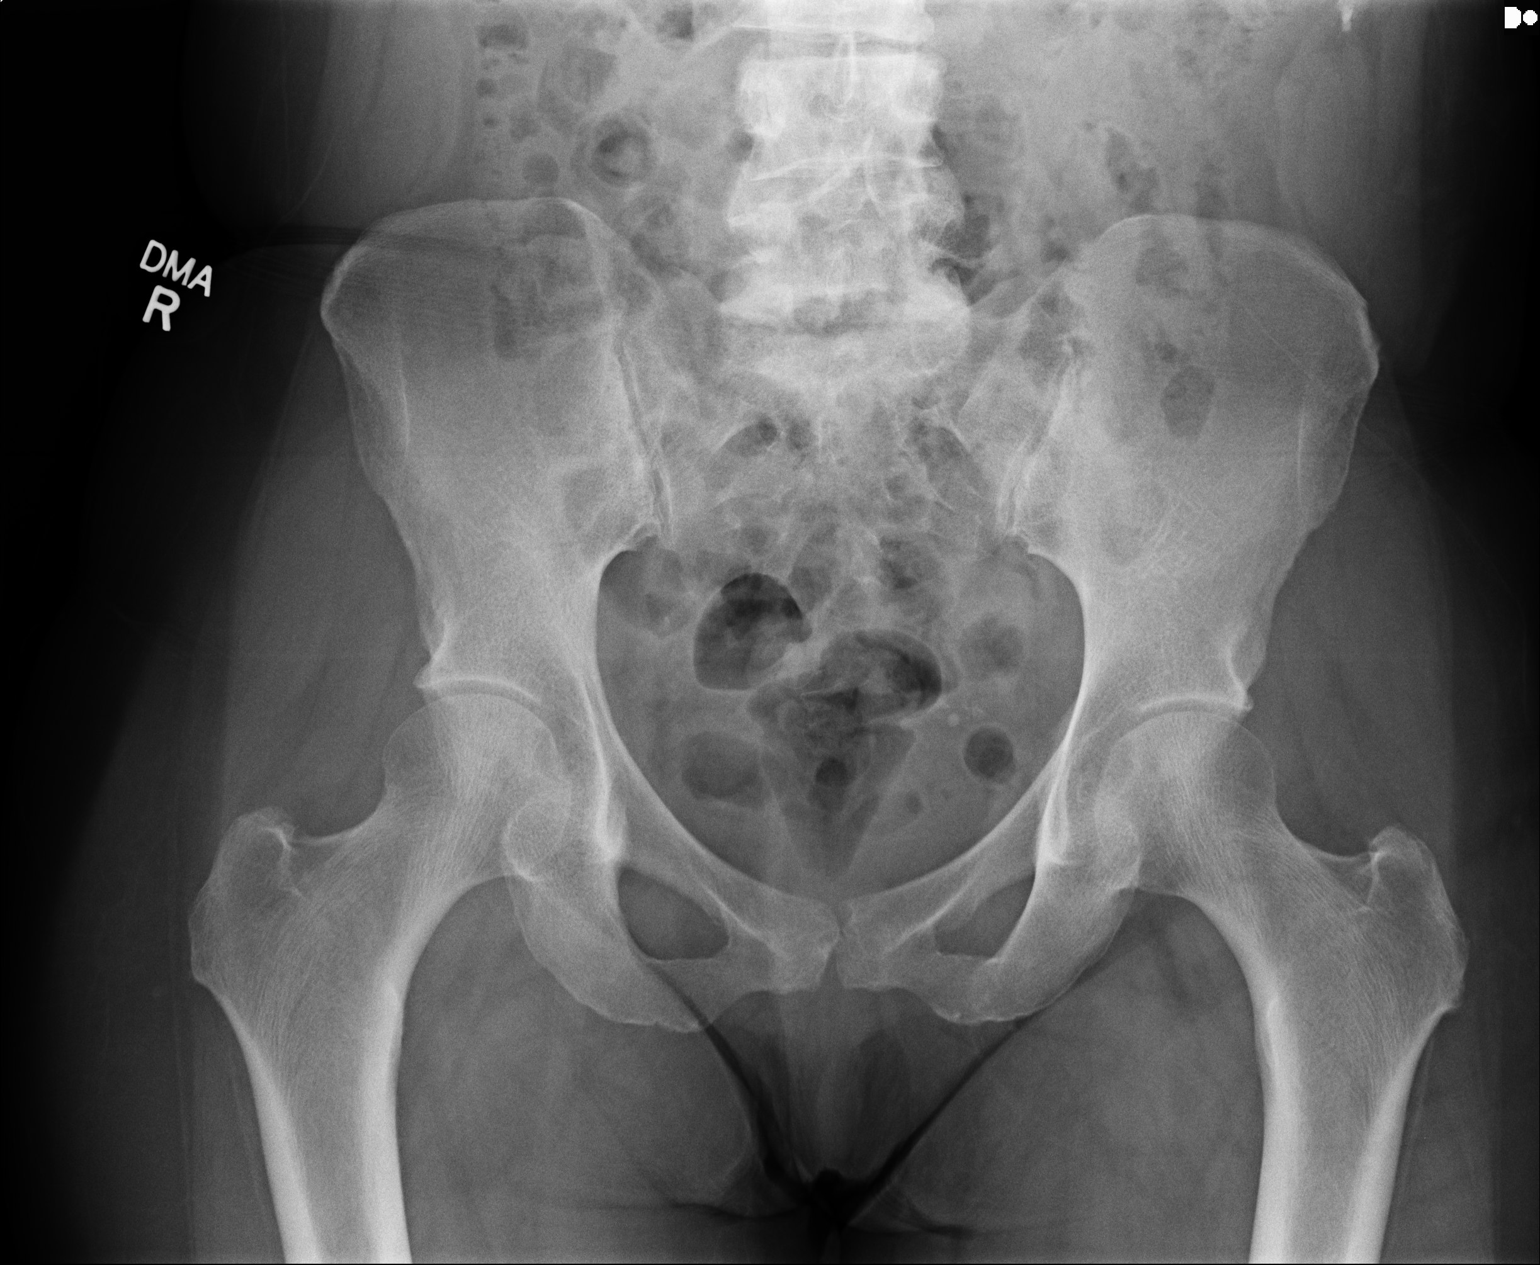
[im 2/3]
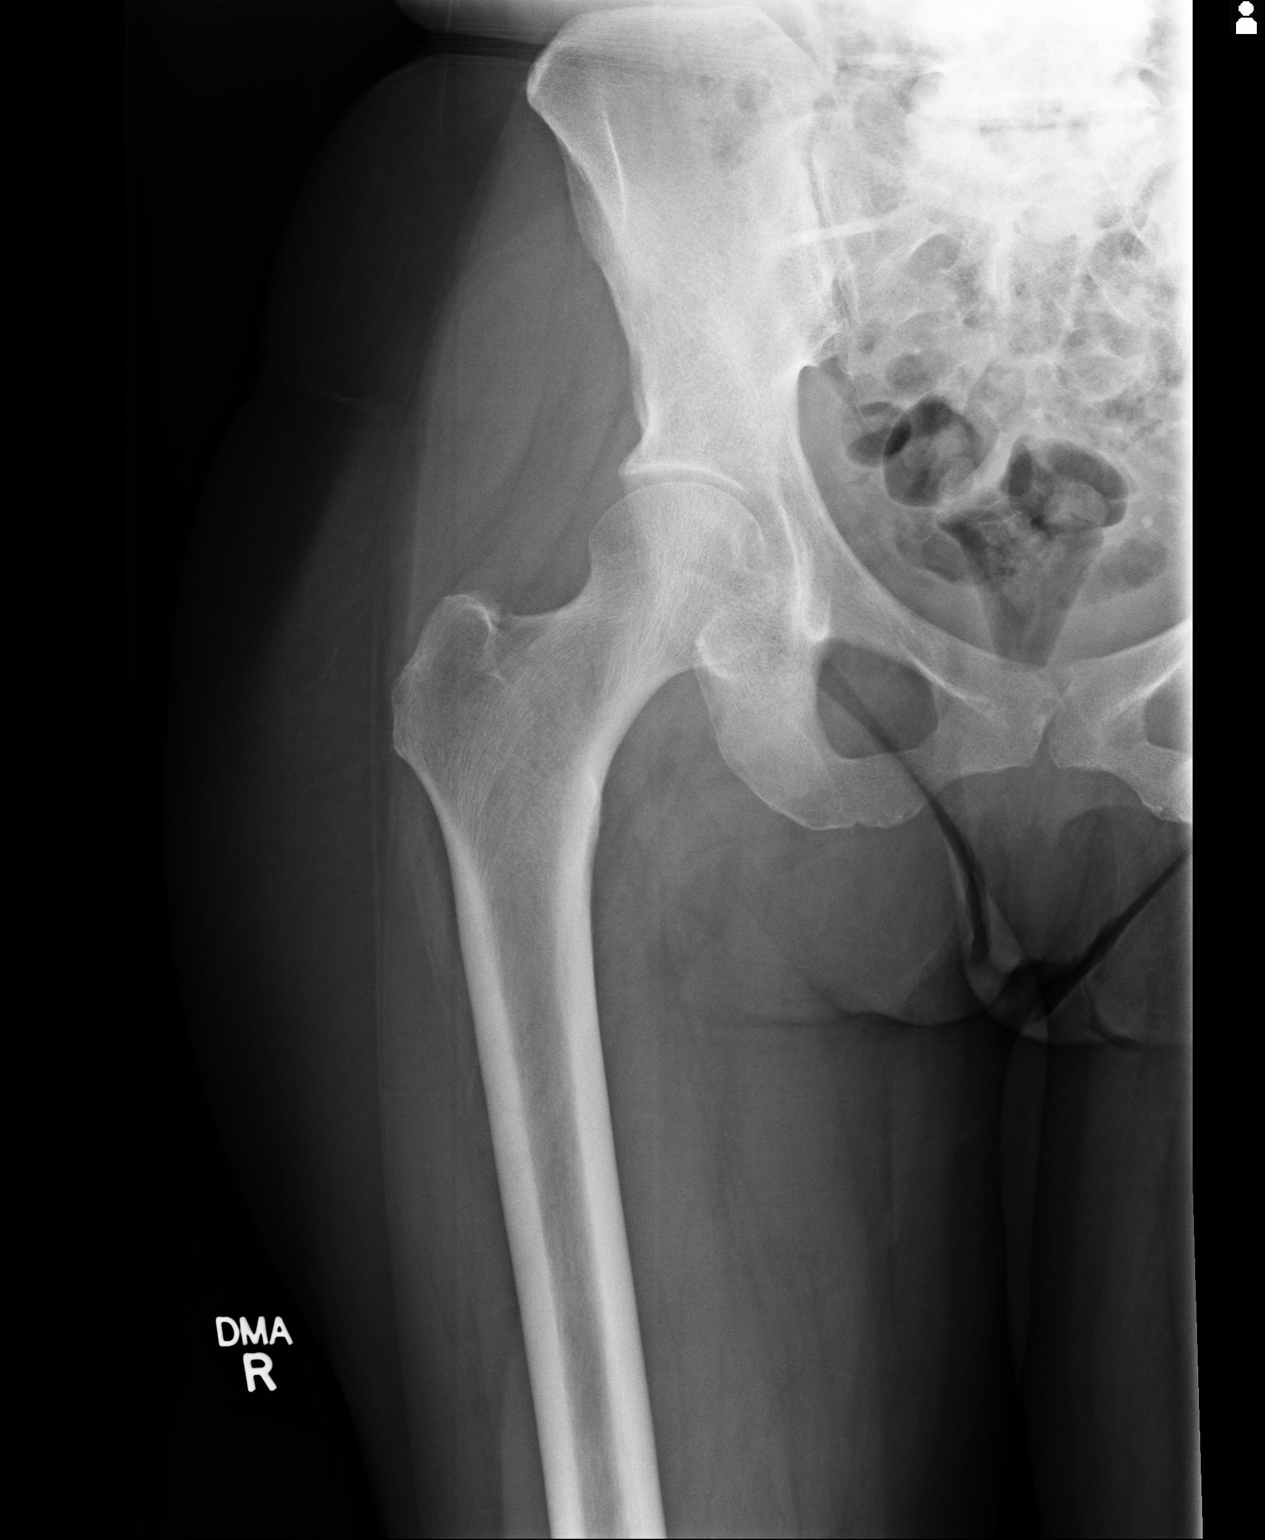
[im 3/3]
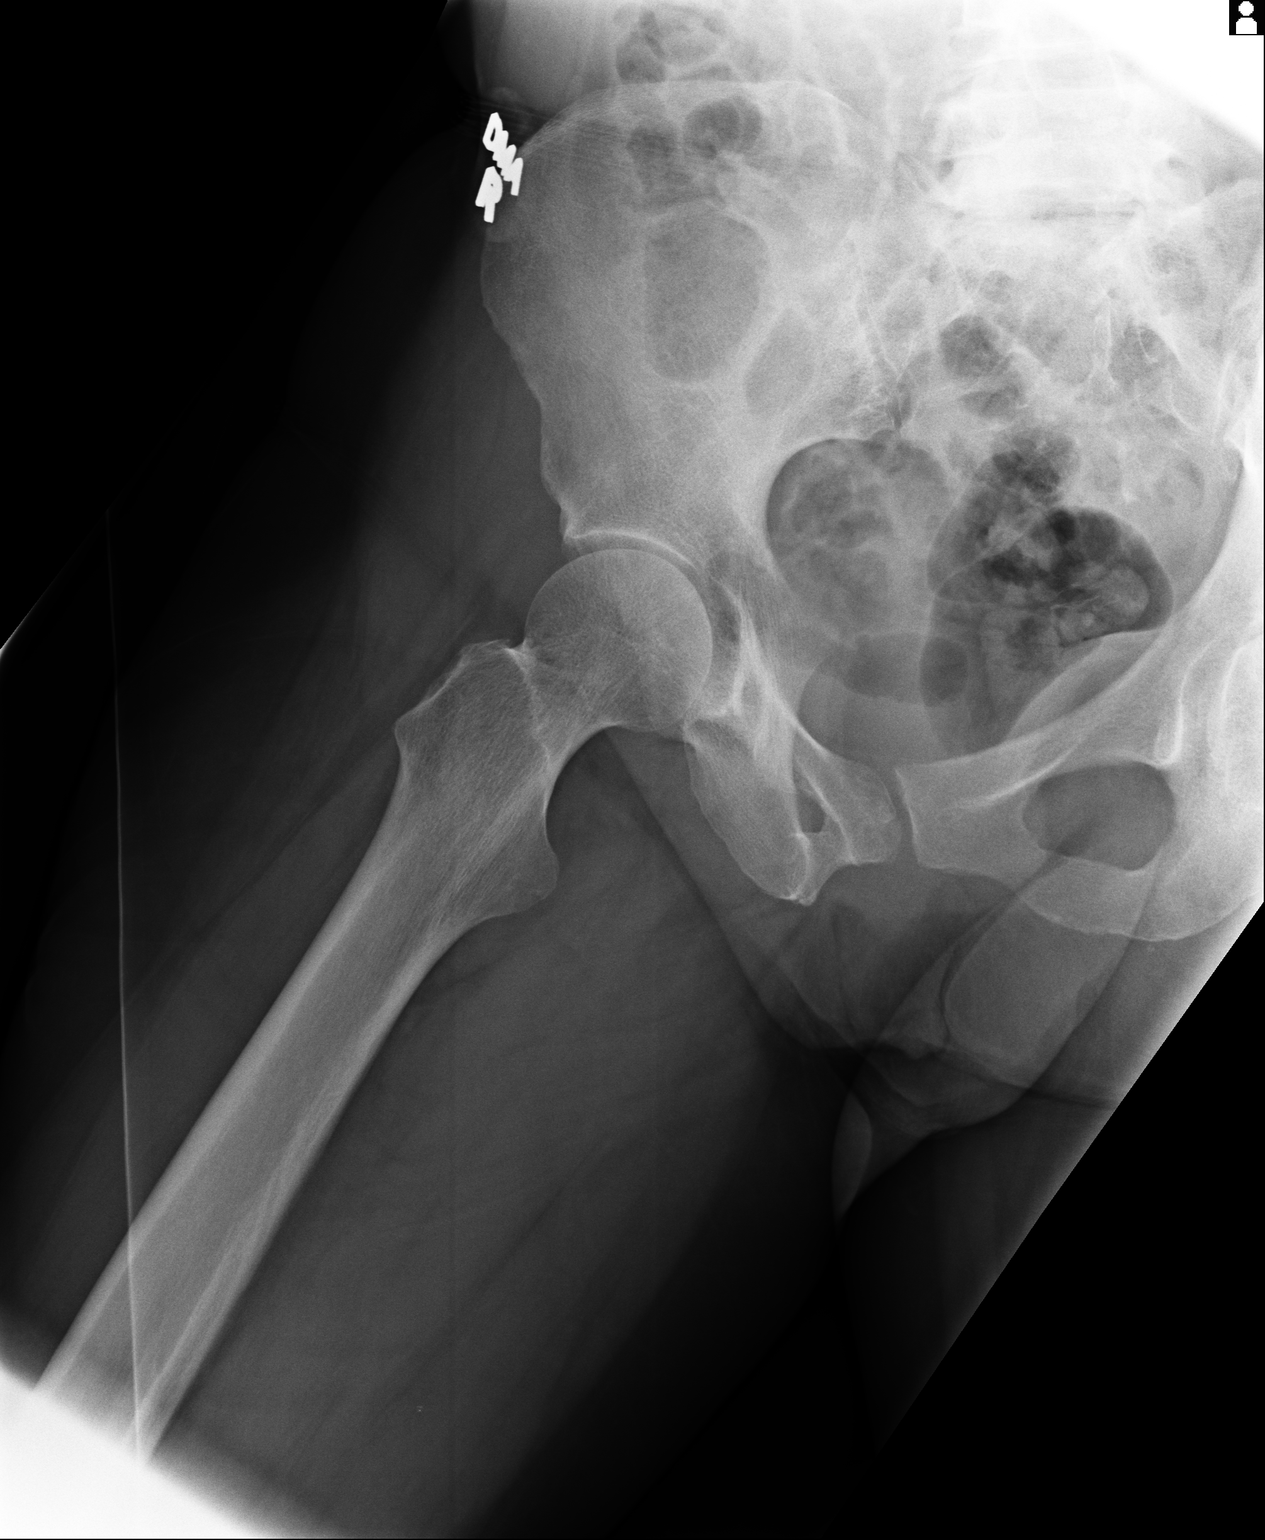

[3 of 3 positions shown; findings below may reference images not displayed]

FINDINGS: Femoral heads are normally located. Hip joint spaces are preserved.
Pelvis intact. Sacral ala and SI joints within normal limits.
Partially visible chronic lumbar disc and facet degeneration.
Proximal left femur appears intact.

Proximal right femur intact.
IMPRESSION: No acute fracture or dislocation identified about the right hip or
pelvis.

## 2017-05-01 ENCOUNTER — Encounter: Payer: Self-pay | Admitting: Nurse Practitioner

## 2017-05-01 ENCOUNTER — Ambulatory Visit (INDEPENDENT_AMBULATORY_CARE_PROVIDER_SITE_OTHER): Payer: Medicaid Other | Admitting: Nurse Practitioner

## 2017-05-01 VITALS — BP 109/71 | HR 85 | Ht 66.0 in | Wt 141.4 lb

## 2017-05-01 DIAGNOSIS — M5442 Lumbago with sciatica, left side: Secondary | ICD-10-CM | POA: Diagnosis not present

## 2017-05-01 MED ORDER — GABAPENTIN 100 MG PO CAPS: 100.0000 mg | ORAL_CAPSULE | Freq: Three times a day (TID) | ORAL | 3 refills | Status: DC

## 2017-05-01 MED ORDER — NAPROXEN 500 MG PO TABS: 500.0000 mg | ORAL_TABLET | Freq: Two times a day (BID) | ORAL | 1 refills | Status: DC

## 2017-05-01 MED ORDER — BACLOFEN 10 MG PO TABS: 10.0000 mg | ORAL_TABLET | Freq: Three times a day (TID) | ORAL | 0 refills | Status: DC

## 2017-05-01 NOTE — Progress Notes (Signed)
Subjective:    Patient ID: Connie Moreno, female    DOB: Feb 12, 1961, 56 y.o.   MRN: [id number]  Connie Moreno is a 56 y.o. female presenting on 05/01/2017 for Hip Injury (left hip pain, pt injured her back x 1 week ago and the pain radiates down the back through the hip. )   HPI  Back Pain Wednesday pt notes she injured back.  She was cleaning storage building, pulling ladder with lateral twisting and pulling while bending motions. She was moving the large ladder past/toward her right side.  Felt a pop in back, got dizzy and sat down to rest.  Initial pain w/ injury was sudden, severe sharp pain that shot down her back to left leg and hip.  Burning/electrical sensation of pain shoots down back of leg Not able to sleep - r/t pain.  Has taken aleeve 2 tablet twice to three times daily w/o relief.  Had been taking some flexeril - makes her sleepy, but not providing lasting relief.  Occasionally cries w/ pain it is so unrelenting. 9-10/10 constantly.  Occasionally subsides.  Now sore in lower abdomen because she is not using her spinal muscles.  Prior back injury - this is worse.  Denies loss of function of bowel / bladder.  Social History  Substance Use Topics  . Smoking status: Current Every Day Smoker    Packs/day: 0.50    Years: 39.00    Types: Cigarettes  . Smokeless tobacco: Current User  . Alcohol use Yes     Comment: Drinks a 40 oz. daily.     Review of Systems Per HPI unless specifically indicated above     Objective:    BP 109/71 (BP Location: Right Arm, Patient Position: Sitting, Cuff Size: Normal)   Pulse 85   Ht 5\' 6"  (1.676 m)   Wt 141 lb 6.4 oz (64.1 kg)   BMI 22.82 kg/m   Wt Readings from Last 3 Encounters:  05/01/17 141 lb 6.4 oz (64.1 kg)  05/26/16 137 lb 12.8 oz (62.5 kg)  01/07/16 145 lb (65.8 kg)    Physical Exam  General - healthy, well-appearing, appears in pain, NAD HEENT - Normocephalic, atraumatic Heart - RRR, no murmurs heard Lungs -  Clear throughout all lobes, no wheezing, crackles, or rhonchi. Normal work of breathing. Musculoskeletal: Low Back Inspection: Normal appearance, no spinal deformity, symmetrical. Palpation: No tenderness over spinous processes. Bilateral lumbar paraspinal muscles tender and with hypertonicity/spasm from T5-6 to S1 on left and T1-S1 on right. ROM: Limited active ROM forward flex / back extension, rotation L/R with discomfort Special Testing: Seated SLR negative for radicular pain on right.  Seated SLR with reproduced localized L back pain w/ radicular pain.  Strength: Bilateral hip flex/ext 4/5, knee flex/ext 5/5, ankle dorsiflex/plantarflex 5/5 Neurovascular: intact distal sensation to light touch Extremeties - non-tender, no edema, cap refill < 2 seconds, peripheral pulses intact +2 bilaterally Skin - warm, dry, no rashes Neuro - awake, alert, oriented x3, intact muscle strength 5/5 bilaterally, intact distal sensation to light touch, normal coordination, normal gait Psych - Normal mood and affect, normal behavior    Results for orders placed or performed in visit on 05/26/16  Pap,SurePath with HPV  Result Value Ref Range   HPV DNA High Risk Detected (A)    Specimen adequacy:     FINAL DIAGNOSIS:     COMMENTS:     Cytotechnologist:        Assessment & Plan:  Problem List Items Addressed This Visit    None    Visit Diagnoses    Acute midline low back pain with left-sided sciatica    -  Primary Pain likely self-limited.  Muscle strain possible complicated by lifting and twisting action while moving a ladder.  Hypertonicity of paraspinal muscles of thoracic and lumbar spine of left and lumbar/partial thoracic spine on right.  Plan:  1. Treat with NSAIDs (acetaminophen and naproxen).  Discussed alternate dosing and max dosing. 2. Apply heat and/or ice to affected area. 3. May also apply a muscle rub with lidocaine after heat or ice. 4. May take baclofen 10 mg up to tid for muscle  spasm.  May take 1/2 tablet if drowsiness occurs. 5. Take gabapentin up to tid for nerve pain/sciatica.  May take all 3 tablets at night if drowsiness during daytime. 6. PT referral placed.  7. Follow up 4-6 weeks.    Relevant Medications   baclofen (LIORESAL) 10 MG tablet   naproxen (NAPROSYN) 500 MG tablet   gabapentin (NEURONTIN) 100 MG capsule   Other Relevant Orders   Ambulatory referral to Physical Therapy      Meds ordered this encounter  Medications  . baclofen (LIORESAL) 10 MG tablet    Sig: Take 1 tablet (10 mg total) by mouth 3 (three) times daily.    Dispense:  30 each    Refill:  0    Order Specific Question:   Supervising Provider    Answer:   Smitty CordsKARAMALEGOS, ALEXANDER J [[id number]]  . naproxen (NAPROSYN) 500 MG tablet    Sig: Take 1 tablet (500 mg total) by mouth 2 (two) times daily with a meal.    Dispense:  30 tablet    Refill:  1    Order Specific Question:   Supervising Provider    Answer:   Smitty CordsKARAMALEGOS, ALEXANDER J [[id number]]  . gabapentin (NEURONTIN) 100 MG capsule    Sig: Take 1 capsule (100 mg total) by mouth 3 (three) times daily.    Dispense:  90 capsule    Refill:  3    Order Specific Question:   Supervising Provider    Answer:   Smitty CordsKARAMALEGOS, ALEXANDER J [[id number]]      Follow up plan: Return in 6 weeks if symptoms do not improve,  Call or come back sooner if  worsens.   Wilhelmina McardleLauren Xiara Knisley, DNP, AGPCNP-BC Adult Gerontology Primary Care Nurse Practitioner [facility] 05/01/2017, 3:47 PM

## 2017-05-01 NOTE — Patient Instructions (Addendum)
Connie Moreno, Thank you for coming in to clinic today.  1. For your back pain: - Please go to physical therapy. - Start taking Tylenol extra strength 1 to 2 tablets every 6-8 hours for aches or fever/chills for next few days as needed.  Do not take more than 3,000 mg in 24 hours from all medicines.   - May also take 500 mg naproxen sodium twice daily - take baclofen 10 mg up to three times daily.  Can take 1/2 pill during day if it makes you sleepy. - Take gabapentin 100 mg up to three times daily or two-three tablets at bedtime for your nerve pain. - Use heat and ice.  Apply this for 15 minutes at a time 6-8 times per day.   - Muscle rub with lidocaine.  Avoid using this with heat and ice to avoid burns.  Please schedule a follow-up appointment with Wilhelmina Mcardle, AGNP to Return in 6 weeks if symptoms do not improve,  Call or come back sooner if  worsens.  If you have any other questions or concerns, please feel free to call the clinic or send a message through MyChart. You may also schedule an earlier appointment if necessary.  Wilhelmina Mcardle, DNP, AGNP-BC Adult Gerontology Nurse Practitioner [facility]             Low Back Pain Exercises  See other page with pictures of each exercise.  Start with 1 or 2 of these exercises that you are most comfortable with. Do not do any exercises that cause you significant worsening pain. Some of these may cause some "stretching soreness" but it should go away after you stop the exercise, and get better over time. Gradually increase up to 3-4 exercises as tolerated.  Standing hamstring stretch: Place the heel of your leg on a stool about 15 inches high. Keep your knee straight. Lean forward, bending at the hips until you feel a mild stretch in the back of your thigh. Make sure you do not roll your shoulders and bend at the waist when doing this or you will stretch your lower back instead. Hold the stretch for 15 to 30  seconds. Repeat 3 times. Repeat the same stretch on your other leg.  Cat and camel: Get down on your hands and knees. Let your stomach sag, allowing your back to curve downward. Hold this position for 5 seconds. Then arch your back and hold for 5 seconds. Do 3 sets of 10.  Quadriped Arm/Leg Raises: Get down on your hands and knees. Tighten your abdominal muscles to stiffen your spine. While keeping your abdominals tight, raise one arm and the opposite leg away from you. Hold this position for 5 seconds. Lower your arm and leg slowly and alternate sides. Do this 10 times on each side.  Pelvic tilt: Lie on your back with your knees bent and your feet flat on the floor. Tighten your abdominal muscles and push your lower back into the floor. Hold this position for 5 seconds, then relax. Do 3 sets of 10.  Partial curl: Lie on your back with your knees bent and your feet flat on the floor. Tighten your stomach muscles and flatten your back against the floor. Tuck your chin to your chest. With your hands stretched out in front of you, curl your upper body forward until your shoulders clear the floor. Hold this position for 3 seconds. Don't hold your breath. It helps to breathe out as you lift your  shoulders up. Relax. Repeat 10 times. Build to 3 sets of 10. To challenge yourself, clasp your hands behind your head and keep your elbows out to the side.  Lower trunk rotation: Lie on your back with your knees bent and your feet flat on the floor. Tighten your abdominal muscles and push your lower back into the floor. Keeping your shoulders down flat, gently rotate your legs to one side, then the other as far as you can. Repeat 10 to 20 times.  Single knee to chest stretch: Lie on your back with your legs straight out in front of you. Bring one knee up to your chest and grasp the back of your thigh. Pull your knee toward your chest, stretching your buttock muscle. Hold this position for 15 to 30 seconds and return  to the starting position. Repeat 3 times on each side.  Double knee to chest: Lie on your back with your knees bent and your feet flat on the floor. Tighten your abdominal muscles and push your lower back into the floor. Pull both knees up to your chest. Hold for 5 seconds and repeat 10 to 20 times.

## 2017-05-04 ENCOUNTER — Other Ambulatory Visit: Payer: Self-pay | Admitting: Nurse Practitioner

## 2017-05-04 NOTE — Progress Notes (Signed)
I have reviewed this encounter including the documentation in this note and/or discussed this patient with the provider, Lauren Kennedy, AGPCNP-BC. I am certifying that I agree with the content of this note as supervising physician.  Shealeigh Dunstan, DO [facility] 05/04/2017, 8:34 AM 

## 2017-05-04 NOTE — Progress Notes (Signed)
Pt called to ask about FL2 paperwork. Received additional information from pt to complete this form.  Pt requesting placement for substance abuse disorder recovery.

## 2017-05-19 ENCOUNTER — Encounter: Payer: Self-pay | Admitting: Physical Therapy

## 2017-05-19 ENCOUNTER — Ambulatory Visit: Payer: Medicaid Other | Attending: Nurse Practitioner | Admitting: Physical Therapy

## 2017-05-19 DIAGNOSIS — M5440 Lumbago with sciatica, unspecified side: Secondary | ICD-10-CM

## 2017-05-19 NOTE — Therapy (Addendum)
[facility] [address] [place], [place], [address] Phone: [phone]   Fax:  [phone]  Physical Therapy Evaluation  Patient Details  Name: Connie Moreno MRN: [id number] Date of Birth: 12/12/1960 Referring Provider: Wilhelmina McardleKENNEDY, LAUREN RENEE  Encounter Date: 05/19/2017      PT End of Session - 05/19/17 1615    Visit Number 1   Number of Visits 1   Authorization Type medicaid   PT Start Time 0400   PT Stop Time 0445   PT Time Calculation (min) 45 min   Activity Tolerance Patient limited by pain   Behavior [facility] for tasks assessed/performed;Restless      Past Medical History:  Diagnosis Date  . Arthritis   . Asthma   . Coronary artery disease   . Hepatitis   . MI (myocardial infarction) [facility](HCC)    age in her mid 3720's  . Vitamin D deficiency     Past Surgical History:  Procedure Laterality Date  . ANKLE SURGERY     metal plate and screws in right ankle.   Marland Kitchen. KNEE SURGERY     bilateral knee surgery     There were no vitals filed for this visit.       Subjective Assessment - 05/19/17 1607    Subjective Patient having low back pain down her LLE for years. She is having trouble with standing, sitting and walking and is not able to perform houshold tasks.    Pertinent History Patient has had back pain for years. About 3 months ago it started going down the left  leg down to her big toe. She is taking pain medications. She is not able to walk very far or play with her grandchildren.  She has not had MRI or x ray.    Limitations Sitting;Standing;Walking   How long can you sit comfortably? 10 mins   How long can you stand comfortably? 10 mins   How long can you walk comfortably? 5 mins   Diagnostic tests no diagnostic tests   Patient Stated Goals to get out of pain and to walk better   Currently in Pain? Yes   Pain Score 10-Worst pain ever   Pain Location Back   Pain Orientation Left   Pain  Descriptors / Indicators Aching;Cramping   Pain Type Chronic pain   Pain Onset More than a month ago   Pain Frequency Constant   Aggravating Factors  walking   Pain Relieving Factors medicine   Effect of Pain on Daily Activities unable to do her activities   Multiple Pain Sites No            [facility] - 05/19/17 1611      Assessment   Referring Provider Wilhelmina McardleKENNEDY, LAUREN RENEE   Onset Date/Surgical Date 02/16/17   Hand Dominance Left   Next MD Visit --  6 weeks   Prior Therapy --  no     Precautions   Precautions Fall     Restrictions   Weight Bearing Restrictions No     Balance Screen   Has the patient fallen in the past 6 months Yes   How many times? 5   Has the patient had a decrease in activity level because of a fear of falling?  Yes   Is the patient reluctant to leave their home because of a fear of falling?  Yes     Home Environment   Living Environment Private residence  Living Arrangements Other relatives  grand mother   Available Help at Discharge Friend(s);Family   Type of Home Mobile home   Home Access Ramped entrance   Home Layout One level   Home Equipment None     Prior Function   Level of Independence Independent   Vocation On disability   Leisure grandchildren     Cognition   Overall Cognitive Status Within Functional Limits for tasks assessed   Attention Focused        POSTURE: fwd trunk posture during standing   PROM/AROM:unable to assess LLE due to pain in all positions, RLE is WFL AROM and PROM  STRENGTH:  Graded on a 0-5 scale Muscle Group Left Right                          Hip Flex NT  4/5  Hip Abd NT  4/5  Hip Add NT  4/5  Hip Ext NT  NT  Hip IR/ER NT  NT  Knee Flex 3/5 4/5  Knee Ext 3/5 4/5  Ankle DF 5/5 3/5  Ankle PF 5/5 4/5   SENSATION: LLE numbness hip to toes   SPECIAL TESTS:+L SLR , + bilateral  prone flex    FUNCTIONAL MOBILITY:     poor mobility with transfers and gait with painful  movements and slow guarded movements  Accessory movements: Unable to assess due to high irritability and tenderness to palpation lumbar spine   GAIT: antalgic gait with forward trunk flex and slow gait speed . 19 m/sec  OUTCOME MEASURES: TEST Outcome Interpretation  5 times sit<>stand 1.31 sec >60 yo, >15 sec indicates increased risk for falls  10 meter walk test .19                 m/s <1.0 m/s indicates increased risk for falls; limited community ambulator                    .       Objective measurements completed on examination: See above findings.                  PT Education - 05/19/17 1614    Education provided Yes   Education Details eval and no treatment coverage due to [employer] insurance   Person(s) Educated Patient   Methods Explanation   Comprehension Verbalized understanding             PT Long Term Goals - 05/19/17 1639      PT LONG TERM GOAL #1   Title [facility] will be [facility].   Time 1   Period Days   Status Achieved                Plan - 05/19/17 1616    Clinical Impression Statement Patient presents with chronic back pain that has gotten worse over the last 3 months and pain is constant and 10/10. She has tenderness to palpation L1- L12 and left SI and left paraspinal musculature.  She has limited  ROM trunk all directions wihtout a directional preference. Repeated  movements do not decrease her pain level. She has decreased gait speed , decreased  5 x sit to stand time 1 min 31 seconds. She transfers and ambulates with antalgic gait pattern She reports numbness in R foot  from a different injury and numbness in her left leg down to her foot. She has + LLE SLR, +  Bliateral prone test . She has been [facility] due to her lack of insurance coverage for treatment.    History and Personal Factors relevant to plan of care: This patient presents with 2  personal factors/ comorbidities falls and pain, and 3   body elements including body structures and functions, activity limitations and or participation restrictions including decreased mobility , falls risk according to  outcome measures, numbness in LLE.  Patient's condition is , evolving   Clinical Presentation Evolving   Clinical Presentation due to: Poor mobiity with transfers and gait due to pain in low back and LLE pain and numbness   Clinical Decision Making Low   Rehab Potential Fair   PT Frequency One time visit   Recommended Other Services [facility]   Consulted and Agree with Plan of Care Patient      Patient will benefit from skilled therapeutic intervention in order to improve the following deficits and impairments:  Abnormal gait, Pain, Decreased mobility, Difficulty walking, Impaired sensation, Decreased activity tolerance, Decreased strength, Impaired flexibility  Visit Diagnosis: Low back pain with sciatica, sciatica laterality unspecified, unspecified back pain laterality, unspecified chronicity - Plan: PT plan of care cert/re-cert      G-Codes - 05-27-17 1639    Functional Assessment Tool Used (Outpatient Only) 10 MW, 5 x sit to stand    Functional Limitation Mobility: Walking and moving around   Mobility: Walking and Moving Around Current Status [phone]) At least 40 percent but less than 60 percent impaired, limited or restricted   Mobility: Walking and Moving Around Goal Status [phone]) At least 40 percent but less than 60 percent impaired, limited or restricted   Mobility: Walking and Moving Around Discharge Status [phone]) At least 40 percent but less than 60 percent impaired, limited or restricted       Problem List Patient Active Problem List   Diagnosis Date Noted  . 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 Nocturnal leg cramps 05/26/2016  . Vitamin D deficiency 05/26/2016  . Chronic knee pain 05/26/2016  . Smoker 11/24/2014  . SOB  (shortness of breath) 11/24/2014  . Asthma, moderate persistent 11/24/2014  . 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 Bilateral leg edema 11/24/2014    Ezekiel Ina, [place] DPT 05/27/2017, 5:21 PM  [place] [facility] MAIN [facility] SERVICES [address] [place], [place], [address] Phone: [phone]   Fax:  [phone]  Name: Connie Moreno MRN: [id number] Date of Birth: 02-Mar-1961

## 2017-05-28 ENCOUNTER — Telehealth: Payer: Self-pay | Admitting: Nurse Practitioner

## 2017-05-28 NOTE — Telephone Encounter (Signed)
Pt [facility] for [facility] but medicaid will not pay for it.  She needs a new [facility] [place].  The phone number there is [phone] and fax [phone].  Her call back number is [phone]

## 2017-05-28 NOTE — Telephone Encounter (Signed)
[facility].

## 2017-12-04 IMAGING — CR DG CHEST 2V
2 series · 2 of 2 positions shown · non-contrast
Comparison: Chest radiograph 03/09/2014.

CLINICAL DATA: Patient with cough, fever and right knee pain.
Centralized chest pain.

EXAM:
CHEST  2 VIEW

[chest pa]
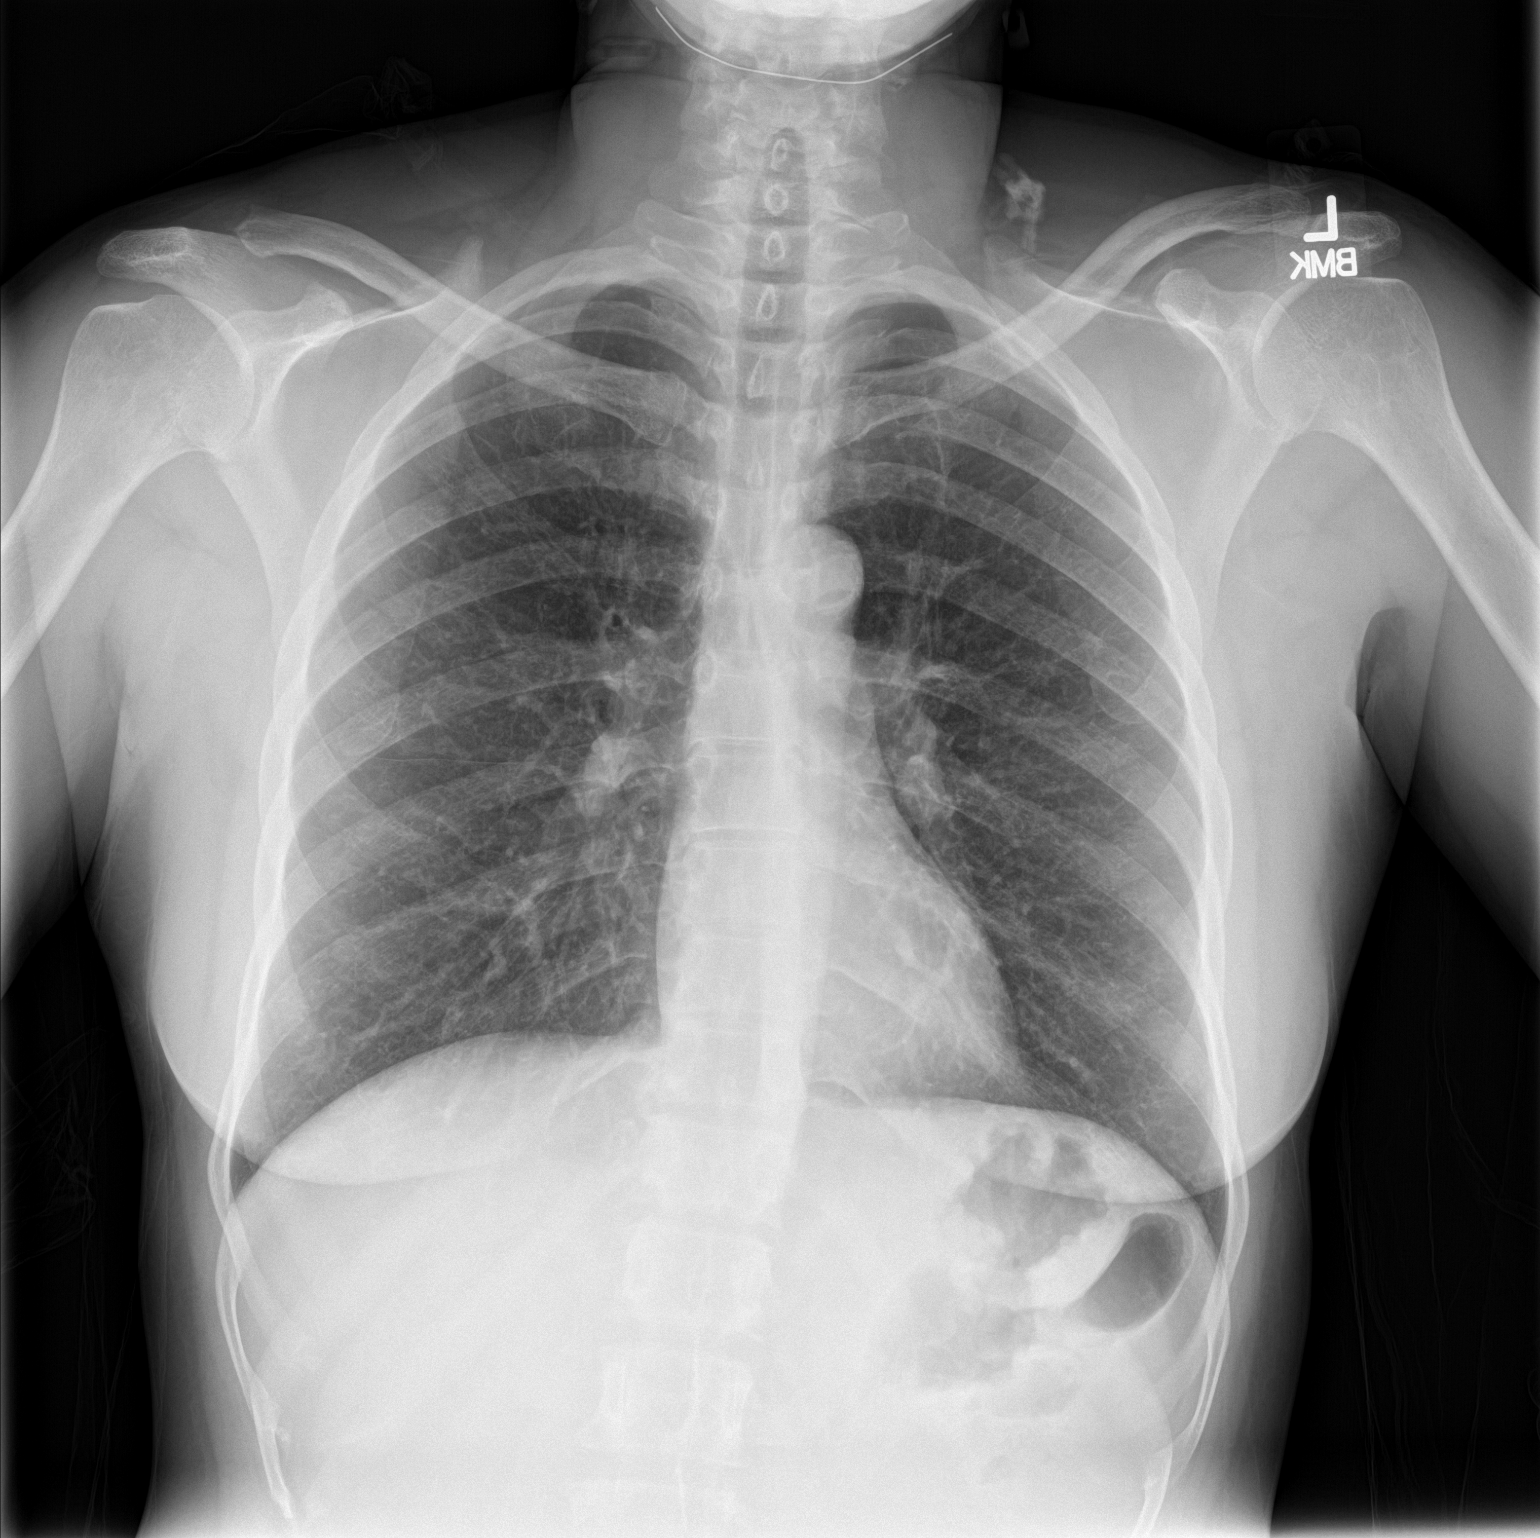

[chest lat]
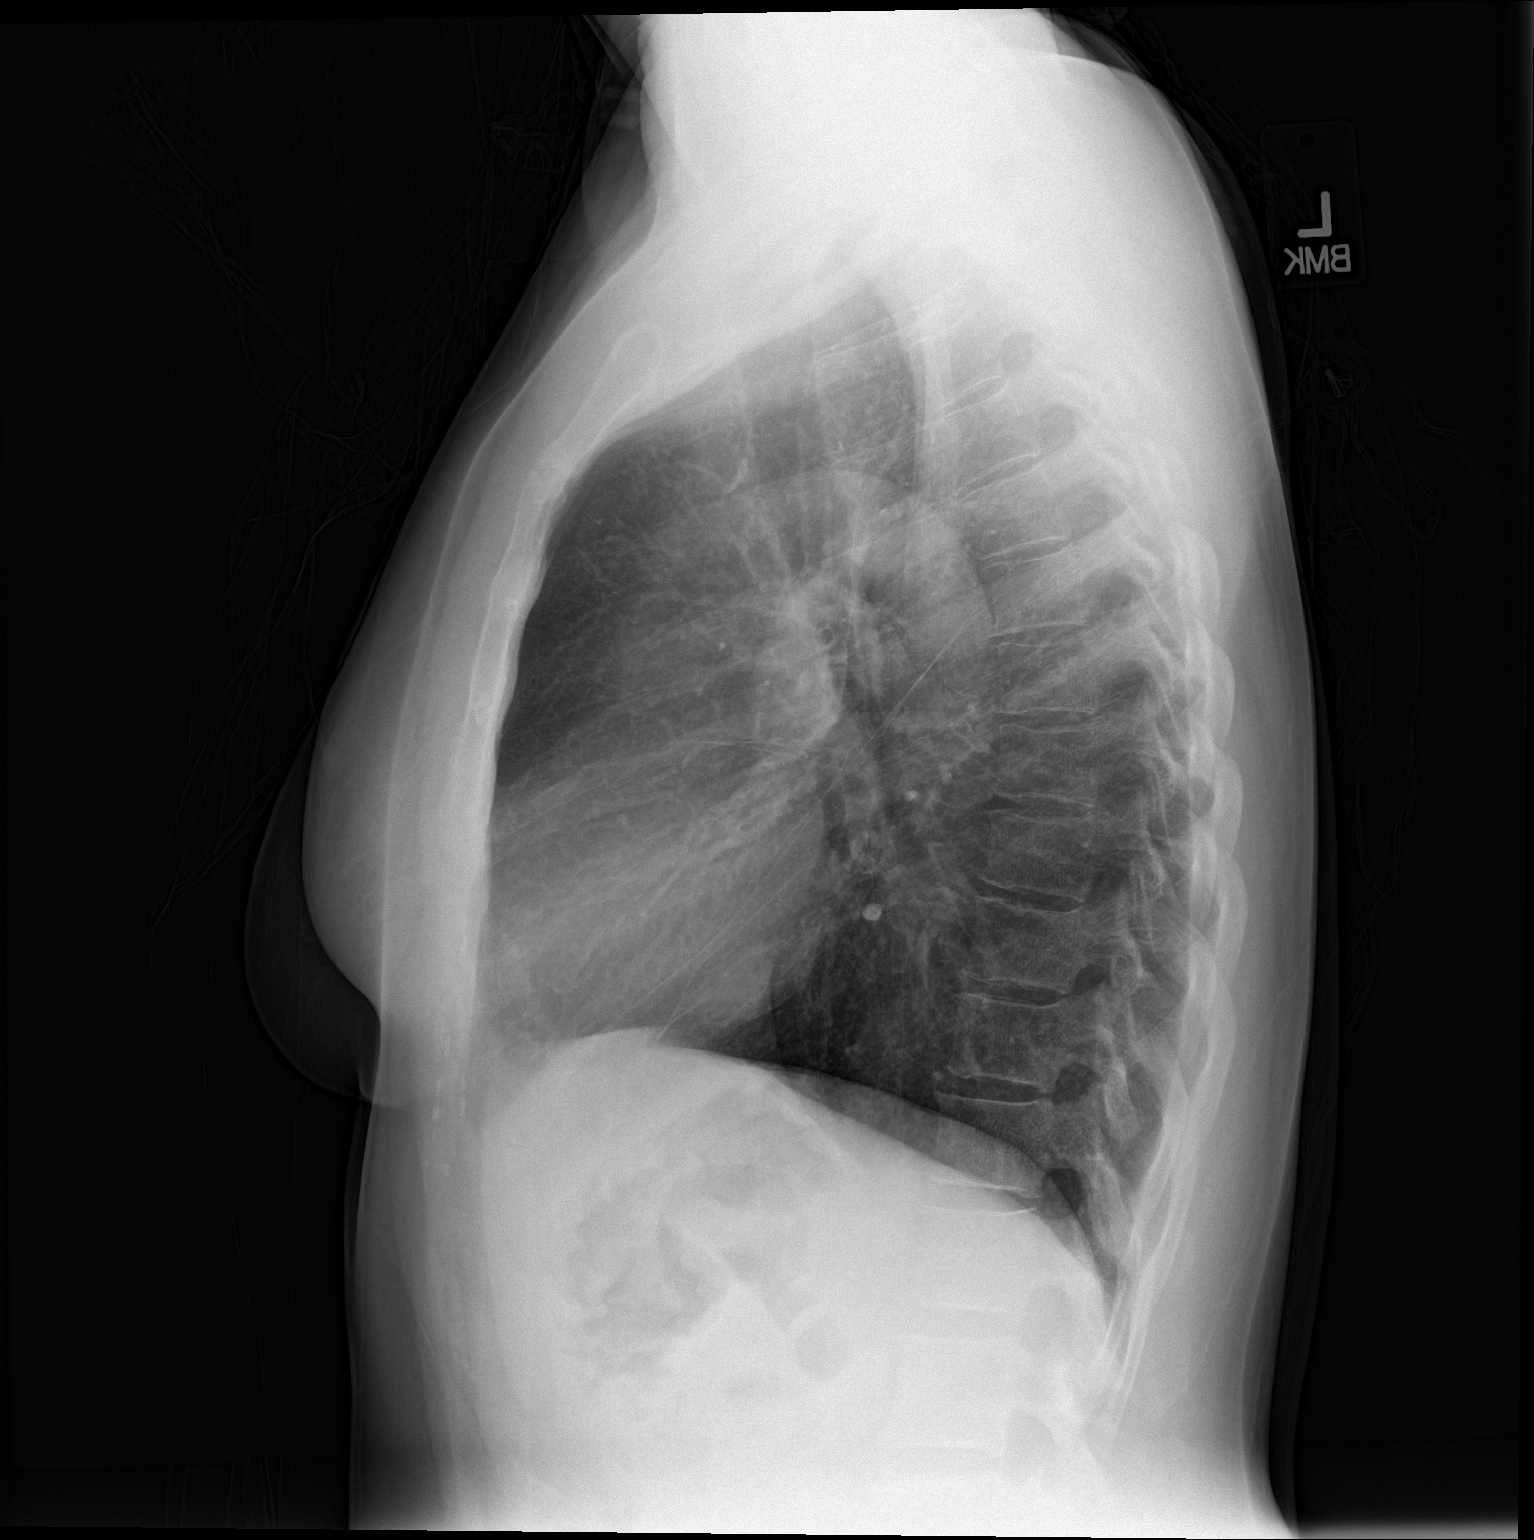

[2 of 2 positions shown; findings below may reference images not displayed]

FINDINGS: Normal cardiac and mediastinal contours. No consolidative pulmonary
opacities. No pleural effusion or pneumothorax. Regional skeleton is
unremarkable.
IMPRESSION: No acute cardiopulmonary process.

## 2018-01-06 ENCOUNTER — Other Ambulatory Visit: Payer: Self-pay

## 2018-01-06 ENCOUNTER — Emergency Department: Payer: Medicaid Other

## 2018-01-06 ENCOUNTER — Emergency Department
Admission: EM | Admit: 2018-01-06 | Discharge: 2018-01-06 | Disposition: A | Discharge status: Home / Self Care | Payer: Medicaid Other | Attending: Student in an Organized Health Care Education/Training Program | Admitting: Student in an Organized Health Care Education/Training Program

## 2018-01-06 DIAGNOSIS — Z79899 Other long term (current) drug therapy: Secondary | ICD-10-CM | POA: Insufficient documentation

## 2018-01-06 DIAGNOSIS — I252 Old myocardial infarction: Secondary | ICD-10-CM | POA: Insufficient documentation

## 2018-01-06 DIAGNOSIS — M25511 Pain in right shoulder: Secondary | ICD-10-CM | POA: Diagnosis present

## 2018-01-06 DIAGNOSIS — J45909 Unspecified asthma, uncomplicated: Secondary | ICD-10-CM | POA: Insufficient documentation

## 2018-01-06 DIAGNOSIS — I2510 Atherosclerotic heart disease of native coronary artery without angina pectoris: Secondary | ICD-10-CM | POA: Diagnosis not present

## 2018-01-06 DIAGNOSIS — F17210 Nicotine dependence, cigarettes, uncomplicated: Secondary | ICD-10-CM | POA: Diagnosis not present

## 2018-01-06 LAB — TROPONIN I

## 2018-01-06 LAB — BASIC METABOLIC PANEL
ANION GAP: 8 (ref 5–15)
BUN: 16 mg/dL (ref 6–20)
CHLORIDE: 108 mmol/L (ref 101–111)
CO2: 22 mmol/L (ref 22–32)
Calcium: 9.1 mg/dL (ref 8.9–10.3)
Creatinine, Ser: 0.92 mg/dL (ref 0.44–1.00)
GFR calc non Af Amer: >60 mL/min (ref >60)
Glucose, Bld: 107 mg/dL — ABNORMAL HIGH (ref 65–99)
POTASSIUM: 4 mmol/L (ref 3.5–5.1)
Sodium: 138 mmol/L (ref 135–145)

## 2018-01-06 LAB — CBC
HCT: 43 % (ref 35.0–47.0)
HEMOGLOBIN: 14.6 g/dL (ref 12.0–16.0)
MCH: 32.8 pg (ref 26.0–34.0)
MCHC: 33.9 g/dL (ref 32.0–36.0)
MCV: 96.7 fL (ref 80.0–100.0)
Platelets: 192 10*3/uL (ref 150–440)
RBC: 4.45 MIL/uL (ref 3.80–5.20)
RDW: 13.4 % (ref 11.5–14.5)
WBC: 4.8 10*3/uL (ref 3.6–11.0)

## 2018-01-06 MED ORDER — GI COCKTAIL ~~LOC~~
ORAL | Status: AC
  Filled 2018-01-06: qty 30

## 2018-01-06 MED ORDER — CYCLOBENZAPRINE HCL 5 MG PO TABS: 5.0000 mg | ORAL_TABLET | Freq: Three times a day (TID) | ORAL | 0 refills | Status: DC | PRN

## 2018-01-06 MED ORDER — HYDROCODONE-ACETAMINOPHEN 5-325 MG PO TABS: 1.0000 | ORAL_TABLET | ORAL | 0 refills | Status: DC | PRN

## 2018-01-06 MED ORDER — NAPROXEN 375 MG PO TABS
375.0000 mg | ORAL_TABLET | Freq: Once | ORAL | Status: DC
  Filled 2018-01-06 (×2): qty 1

## 2018-01-06 MED ORDER — DIAZEPAM 5 MG PO TABS
5.0000 mg | ORAL_TABLET | Freq: Once | ORAL | Status: DC
  Administered 2018-01-06: 5 mg via ORAL

## 2018-01-06 MED ORDER — CYCLOBENZAPRINE HCL 10 MG PO TABS
5.0000 mg | ORAL_TABLET | Freq: Once | ORAL | Status: AC
  Administered 2018-01-06: 5 mg via ORAL
  Filled 2018-01-06: qty 1

## 2018-01-06 MED ORDER — GI COCKTAIL ~~LOC~~: 30.0000 mL | Freq: Once | ORAL | Status: DC

## 2018-01-06 MED ORDER — NAPROXEN 375 MG PO TABS: 375.0000 mg | ORAL_TABLET | Freq: Two times a day (BID) | ORAL | 0 refills | Status: AC

## 2018-01-06 MED ORDER — DIAZEPAM 5 MG PO TABS
ORAL_TABLET | ORAL | Status: AC
  Administered 2018-01-06: 5 mg via ORAL
  Filled 2018-01-06: qty 1

## 2018-01-06 NOTE — ED Triage Notes (Signed)
Right sided shoulder pain X 1 week, radiates to right side of chest. Sharp, worse with movement. No injury.  Pt alert and oriented X4, active, cooperative, pt in NAD. RR even and unlabored, color WNL.

## 2018-01-06 NOTE — ED Provider Notes (Addendum)
[facility] Emergency Department Provider Note    First MD Initiated Contact with Patient 01/06/18 1623     (approximate)  I have reviewed the triage vital signs and the nursing notes.   HISTORY  Chief Complaint Shoulder Pain and Chest Pain    HPI Connie Moreno is a 57 y.o. female with a history of arthritis presents with chief complaint of several days of right shoulder pain.  Denies any trauma.  No fevers.  States pain is worse with movement and tracks up into her neck.  Denies any numbness or tingling.  No chest pain or shortness of breath.  No previous surgeries on that shoulder.  No history of gout.  States that she tried Lidoderm patch without any improvement.  Does not work but states that she has been moving furniture and cleaning her house more recently and may have injured it then.  Past Medical History:  Diagnosis Date  . Arthritis   . Asthma   . Coronary artery disease   . Hepatitis   . MI (myocardial infarction) [facility](HCC)    age in her mid 5320's  . Vitamin D deficiency    Family History  Problem Relation Age of Onset  . Hypertension Brother   . Hypertension Brother    Past Surgical History:  Procedure Laterality Date  . ANKLE SURGERY     metal plate and screws in right ankle.   Marland Kitchen. KNEE SURGERY     bilateral knee surgery    Patient Active Problem List   Diagnosis Date Noted  . 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 Nocturnal leg cramps 05/26/2016  . Vitamin D deficiency 05/26/2016  . Chronic knee pain 05/26/2016  . Smoker 11/24/2014  . SOB (shortness of breath) 11/24/2014  . Asthma, moderate persistent 11/24/2014  . Substance abuse in remission (HCC) 11/24/2014  . Bilateral leg edema 11/24/2014      Prior to Admission medications   Medication Sig Start Date End Date Taking? Authorizing Provider  albuterol (PROVENTIL HFA;VENTOLIN HFA) 108 (90 Base) MCG/ACT inhaler Inhale 2 puffs into the lungs every 6  (six) hours as needed for wheezing or shortness of breath. 05/26/16   Loura PardonKrebs, Amy Lauren, NP  baclofen (LIORESAL) 10 MG tablet Take 1 tablet (10 mg total) by mouth 3 (three) times daily. 05/01/17   Galen ManilaKennedy, Lauren Renee, NP  beclomethasone (QVAR) 80 MCG/ACT inhaler Inhale 2 puffs into the lungs 2 (two) times daily. 05/26/16   Loura PardonKrebs, Amy Lauren, NP  Cholecalciferol (VITAMIN D) 2000 UNITS CAPS Take 2,000 Units by mouth daily.  11/08/14   [provider]  gabapentin (NEURONTIN) 100 MG capsule Take 1 capsule (100 mg total) by mouth 3 (three) times daily. 05/01/17   Galen ManilaKennedy, Lauren Renee, NP  naproxen (NAPROSYN) 500 MG tablet Take 1 tablet (500 mg total) by mouth 2 (two) times daily with a meal. 05/01/17   Galen ManilaKennedy, Lauren Renee, NP  traMADol (ULTRAM) 50 MG tablet Take 50 mg by mouth every 6 (six) hours as needed.    [provider]  Vitamin D, Ergocalciferol, (DRISDOL) 50000 units CAPS capsule Take 1 capsule (50,000 Units total) by mouth every 7 (seven) days. Patient not taking: Reported on 05/01/2017 05/27/16   Loura PardonKrebs, Amy Lauren, NP    Allergies Patient has no known allergies.    Social History Social History   Tobacco Use  . Smoking status: Current Every Day Smoker    Packs/day: 0.50    Years: 39.00  Pack years: 19.50    Types: Cigarettes  . Smokeless tobacco: Current User  Substance Use Topics  . Alcohol use: Yes    Comment: Drinks a 40 oz. daily.   . Drug use: Yes    Types: Marijuana, Cocaine    Comment: Used cocaine and marijuana for 6 years. Has quit the use of these drugs.     Review of Systems Patient denies headaches, rhinorrhea, blurry vision, numbness, shortness of breath, chest pain, edema, cough, abdominal pain, nausea, vomiting, diarrhea, dysuria, fevers, rashes or hallucinations unless otherwise stated above in HPI. ____________________________________________   PHYSICAL EXAM:  VITAL SIGNS: Vitals:   01/06/18 1431  BP: 117/77  Pulse: 79  Resp: 16    Temp: 98.4 F (36.9 C)  SpO2: 99%    Constitutional: Alert and oriented.  in no acute distress. Eyes: Conjunctivae are normal.  Head: Atraumatic. Nose: No congestion/rhinnorhea. Mouth/Throat: Mucous membranes are moist.   Neck: No stridor. Painless ROM.  Cardiovascular: Normal rate, regular rhythm. Grossly normal heart sounds.  Good peripheral circulation. Respiratory: Normal respiratory effort.  No retractions. Lungs CTAB. Gastrointestinal: There is pain to palpation along the right posterior scapula no obvious deformity effusion or erythema over the right deltoid or shoulder.  Neurovascular intact distally.  Painless passive range of motion to 90 degrees.  Soft and nontender. No distention. No abdominal bruits. No CVA tenderness. Genitourinary:  Musculoskeletal: No lower extremity tenderness nor edema.  No joint effusions. Neurologic:  Normal speech and language. No gross focal neurologic deficits are appreciated. No facial droop Skin:  Skin is warm, dry and intact. No rash noted. Psychiatric: Mood and affect are normal. Speech and behavior are normal.  ____________________________________________   LABS (all labs ordered are listed, but only abnormal results are displayed)  Results for orders placed or performed during the hospital encounter of 01/06/18 (from the past 24 hour(s))  Basic metabolic panel     Status: Abnormal   Collection Time: 01/06/18  2:35 PM  Result Value Ref Range   Sodium 138 135 - 145 mmol/L   Potassium 4.0 3.5 - 5.1 mmol/L   Chloride 108 101 - 111 mmol/L   CO2 22 22 - 32 mmol/L   Glucose, Bld 107 (H) 65 - 99 mg/dL   BUN 16 6 - 20 mg/dL   Creatinine, Ser 1.61 0.44 - 1.00 mg/dL   Calcium 9.1 8.9 - 09.6 mg/dL   GFR calc non Af Amer >60 >60 mL/min   GFR calc Af Amer >60 >60 mL/min   Anion gap 8 5 - 15  CBC     Status: None   Collection Time: 01/06/18  2:35 PM  Result Value Ref Range   WBC 4.8 3.6 - 11.0 K/uL   RBC 4.45 3.80 - 5.20 MIL/uL    Hemoglobin 14.6 12.0 - 16.0 g/dL   HCT 04.5 40.9 - 81.1 %   MCV 96.7 80.0 - 100.0 fL   MCH 32.8 26.0 - 34.0 pg   MCHC 33.9 32.0 - 36.0 g/dL   RDW 91.4 78.2 - 95.6 %   Platelets 192 150 - 440 K/uL  Troponin I     Status: None   Collection Time: 01/06/18  2:35 PM  Result Value Ref Range   Troponin I <0.03 <0.03 ng/mL   ____________________________________________  EKG My review and personal interpretation at Time: 14:26   Indication: shoulder pain  Rate: 85  Rhythm: sinus Axis: normal Other: normal intervals, no stemi, non specific st abn ____________________________________________  RADIOLOGY  I personally reviewed all radiographic images ordered to evaluate for the above acute complaints and reviewed radiology reports and findings.  These findings were personally discussed with the patient.  Please see medical record for radiology report.  ____________________________________________   PROCEDURES  Procedure(s) performed:  Procedures    Critical Care performed: no ____________________________________________   INITIAL IMPRESSION / ASSESSMENT AND PLAN / ED COURSE  Pertinent labs & imaging results that were available during my care of the patient were reviewed by me and considered in my medical decision making (see chart for details).  DDX: Arthritis, dislocation, contusion, rotator cuff injury  Connie Moreno is a 57 y.o. who presents to the ED with symptoms as described above.  Patient well-appearing in no acute distress.  Seems primarily muscular skeletal related as pain is isolated to the rotator cuff on palpation and exam.  Not clinically consistent with septic arthritis given lack of fever or white count and painless passive range of motion.  No evidence of dissection, pneumothorax, pneumonia or ACS.  Patient stable and appropriate for referral to orthopedics.  Will give home pain medication.  Have discussed with the patient and available family all diagnostics and  treatments performed thus far and all questions were answered to the best of my ability. The patient demonstrates understanding and agreement with plan.       As part of my medical decision making, I reviewed the following data within the electronic MEDICAL RECORD NUMBER Nursing notes reviewed and incorporated, Labs reviewed, notes from prior ED visits and [place] Controlled Substance Database   ____________________________________________   FINAL CLINICAL IMPRESSION(S) / ED DIAGNOSES  Final diagnoses:  Acute pain of right shoulder      NEW MEDICATIONS STARTED DURING THIS VISIT:  New Prescriptions   No medications on file     Note:  This document was prepared using Dragon voice recognition software and may include unintentional dictation errors.    Willy Eddy, MD 01/06/18 1739    Willy Eddy, MD 01/06/18 [phone]

## 2018-01-21 ENCOUNTER — Ambulatory Visit (INDEPENDENT_AMBULATORY_CARE_PROVIDER_SITE_OTHER): Payer: Medicaid Other | Admitting: Nurse Practitioner

## 2018-01-21 ENCOUNTER — Other Ambulatory Visit: Payer: Self-pay

## 2018-01-21 VITALS — BP 103/79 | HR 79 | Temp 98.4°F | Ht 66.0 in | Wt 138.4 lb

## 2018-01-21 DIAGNOSIS — M25511 Pain in right shoulder: Secondary | ICD-10-CM | POA: Diagnosis not present

## 2018-01-21 DIAGNOSIS — J302 Other seasonal allergic rhinitis: Secondary | ICD-10-CM

## 2018-01-21 DIAGNOSIS — J4540 Moderate persistent asthma, uncomplicated: Secondary | ICD-10-CM | POA: Diagnosis not present

## 2018-01-21 DIAGNOSIS — M25552 Pain in left hip: Secondary | ICD-10-CM | POA: Diagnosis not present

## 2018-01-21 MED ORDER — ALBUTEROL SULFATE HFA 108 (90 BASE) MCG/ACT IN AERS: 2.0000 | INHALATION_SPRAY | Freq: Four times a day (QID) | RESPIRATORY_TRACT | 11 refills | Status: DC | PRN

## 2018-01-21 MED ORDER — MONTELUKAST SODIUM 10 MG PO TABS: 10.0000 mg | ORAL_TABLET | Freq: Every day | ORAL | 3 refills | Status: DC

## 2018-01-21 MED ORDER — LIDOCAINE HCL 2 % EX GEL: 1.0000 "application " | CUTANEOUS | 0 refills | Status: DC | PRN

## 2018-01-21 MED ORDER — BUDESONIDE-FORMOTEROL FUMARATE 80-4.5 MCG/ACT IN AERO: 2.0000 | INHALATION_SPRAY | Freq: Two times a day (BID) | RESPIRATORY_TRACT | 3 refills | Status: DC

## 2018-01-21 NOTE — Progress Notes (Signed)
Subjective:    Patient ID: Connie Moreno, female    DOB: 06/24/1961, 57 y.o.   MRN: [id number]  Connie Moreno is a 57 y.o. female presenting on 01/21/2018 for Hip Pain (severe pain from walking) and Medication Refill   HPI Allergy Having lots of trouble with watery eyes, sneezing, hoarseness.  Stuffy nose, sinus congestion. - Uses regular inhalers: Qvar taking 2 puffs twice daily. Also has albuterol inhaler that she uses infrequently.  Hip pain: Pt reports she walked to clinic.  Print production plannerffice manager observed pt walk across the lawn normally and start limping as soon as she walked through the doors to the clinic.   - Pt states she is continuing to have low back and hip pain that is reported as 9/10 pain with shooting and burning.  She stopped taking gabapentin because she ran out, but admits it didn't really help.    Right Shoulder pain: Pt also exhibits right shoulder pain with onset approximately 4 days ago per pt.  She recalls no known injury.  She has no other details to report about shoulder pain, but requests pain medication to relieve the pain.   - She admits to taking street/shared "little pink pill, pain pill" that was 10 mg percocet last night.  She also endorses regular marijuana use.  Social History   Tobacco Use  . Smoking status: Current Every Day Smoker    Packs/day: 0.50    Years: 39.00    Pack years: 19.50    Types: Cigarettes  . Smokeless tobacco: Current User  Substance Use Topics  . Alcohol use: Yes    Comment: Drinks a 40 oz. daily.   . Drug use: Yes    Types: Marijuana, Cocaine    Comment: Used cocaine and marijuana for 6 years. Has quit the use of these drugs.     Review of Systems Per HPI unless specifically indicated above     Objective:    BP 103/79 (BP Location: Left Arm, Patient Position: Sitting, Cuff Size: Normal)   Pulse 79   Temp 98.4 F (36.9 C)   Ht 5\' 6"  (1.676 m)   Wt 138 lb 6.4 oz (62.8 kg)   BMI 22.34 kg/m   Wt Readings from  Last 3 Encounters:  01/21/18 138 lb 6.4 oz (62.8 kg)  01/06/18 155 lb (70.3 kg)  05/01/17 141 lb 6.4 oz (64.1 kg)    Physical Exam  Constitutional: She is oriented to person, place, and time. She appears well-developed and well-nourished. No distress.  HENT:  Head: Normocephalic and atraumatic.  Cardiovascular: Normal rate, regular rhythm, S1 normal, S2 normal, normal heart sounds and intact distal pulses.  Pulmonary/Chest: Effort normal and breath sounds normal. No respiratory distress.  Musculoskeletal:       Right shoulder: Normal. She exhibits normal range of motion, no tenderness, no bony tenderness, no deformity and no spasm.       Left shoulder: Normal.       Left hip: Normal. She exhibits normal range of motion, normal strength, no tenderness and no bony tenderness.       Lumbar back: Normal. She exhibits normal range of motion, no tenderness and no bony tenderness.  Right Shoulder Inspection: Normal appearance bilateral symmetrical Palpation: Non-tender to palpation over anterior, lateral, or posterior shoulder   However, pt drops shoulder violently when palpating for tenderness.   ROM: Full intact active ROM forward flexion, abduction, internal / external rotation, symmetrical Special Testing: Rotator cuff testing negative for weakness  with supraspinatus full can and empty can test, O'brien's negative for labral pain, Hawkin's AC impingement negative for pain Strength: Normal strength 4/5 flex/ext, ext rot / int rot, grip, rotator cuff str testing. Neurovascular: Distally intact pulses, sensation to light touch   Neurological: She is alert and oriented to person, place, and time.  Skin: Skin is warm and dry.  Psychiatric: She has a normal mood and affect. Her behavior is normal.  Vitals reviewed.    Results for orders placed or performed during the hospital encounter of 01/06/18  Basic metabolic panel  Result Value Ref Range   Sodium 138 135 - 145 mmol/L   Potassium 4.0 3.5  - 5.1 mmol/L   Chloride 108 101 - 111 mmol/L   CO2 22 22 - 32 mmol/L   Glucose, Bld 107 (H) 65 - 99 mg/dL   BUN 16 6 - 20 mg/dL   Creatinine, Ser 1.61 0.44 - 1.00 mg/dL   Calcium 9.1 8.9 - 09.6 mg/dL   GFR calc non Af Amer >60 >60 mL/min   GFR calc Af Amer >60 >60 mL/min   Anion gap 8 5 - 15  CBC  Result Value Ref Range   WBC 4.8 3.6 - 11.0 K/uL   RBC 4.45 3.80 - 5.20 MIL/uL   Hemoglobin 14.6 12.0 - 16.0 g/dL   HCT 04.5 40.9 - 81.1 %   MCV 96.7 80.0 - 100.0 fL   MCH 32.8 26.0 - 34.0 pg   MCHC 33.9 32.0 - 36.0 g/dL   RDW 91.4 78.2 - 95.6 %   Platelets 192 150 - 440 K/uL  Troponin I  Result Value Ref Range   Troponin I <0.03 <0.03 ng/mL      Assessment & Plan:   Problem List Items Addressed This Visit      Respiratory   Asthma, moderate persistent   Relevant Medications   albuterol (PROVENTIL HFA;VENTOLIN HFA) 108 (90 Base) MCG/ACT inhaler   budesonide-formoterol (SYMBICORT) 80-4.5 MCG/ACT inhaler   montelukast (SINGULAIR) 10 MG tablet    Other Visit Diagnoses    Seasonal allergies    -  Primary   Relevant Medications   montelukast (SINGULAIR) 10 MG tablet   Acute pain of right shoulder       Relevant Medications   lidocaine (XYLOCAINE) 2 % jelly   Left hip pain       Relevant Medications   lidocaine (XYLOCAINE) 2 % jelly    #1 Consistent with chronic allergic rhinitis, non-seasonal, unknown triggers.  Worsening asthma with increased allergy response. - Today no evidence of acute sinusitis or complication.  - Has not recently used loratadine, cetirizine, flonase  Plan: 1. Start nasal fluticasone 2 sprays each nostril once daily for 4 weeks. 2. START antihistamine cetirizine 10 mg once daily for 3-4 week. 3. May use saline spray twice daily if needed.  Use at alternate time from fluticasone. 4. Continue Symbicort 2 puffs bid, albuterol prn, and montelukast 10 mg daily. 5. Follow-up in future, as needed, consider 2nd opinion from ENT/allergy.     #2 Right  shoulder and left hip pain Pain likely self-limited.  Significant evidence of pain seeking behaviors.  Pt with complaints out of proportion to severity of physical exam.  Special tests for R shoulder completely normal as pt was not sure how to make it appear she had pain or injury with these requests. Muscle strain possible.  Plan:  1. Treat with OTC pain meds (acetaminophen and ibuprofen).  Discussed alternate dosing and  max dosing. 2. Apply heat and/or ice to affected area. 3. May also apply a muscle rub with lidocaine or lidocaine patch after heat or ice. - Lidocaine 2% gel prescribed for pt to use up to 3 times daily for hip and/or shoulder pain. No refills. 4. When requesting muscle relaxers and gabapentin, I stated pt would need clean drug screen to which pt replied, "nevermind" and proceeded to disclose her use of street drugs/opioids.   5. Follow up as needed.   Meds ordered this encounter  Medications  . albuterol (PROVENTIL HFA;VENTOLIN HFA) 108 (90 Base) MCG/ACT inhaler    Sig: Inhale 2 puffs into the lungs every 6 (six) hours as needed for wheezing or shortness of breath.    Dispense:  1 Inhaler    Refill:  11    Order Specific Question:   Supervising Provider    Answer:   Smitty Cords [[id number]]  . budesonide-formoterol (SYMBICORT) 80-4.5 MCG/ACT inhaler    Sig: Inhale 2 puffs into the lungs 2 (two) times daily.    Dispense:  1 Inhaler    Refill:  3    Order Specific Question:   Supervising Provider    Answer:   Smitty Cords [[id number]]  . montelukast (SINGULAIR) 10 MG tablet    Sig: Take 1 tablet (10 mg total) by mouth at bedtime.    Dispense:  30 tablet    Refill:  3    Order Specific Question:   Supervising Provider    Answer:   Smitty Cords [[id number]]  . lidocaine (XYLOCAINE) 2 % jelly    Sig: Apply 1 application topically as needed.    Dispense:  30 mL    Refill:  0    Order Specific Question:   Supervising Provider    Answer:    Smitty Cords [[id number]]    Follow up plan: Return in about 6 months (around 07/23/2018) for asthma.  Wilhelmina Mcardle, DNP, AGPCNP-BC Adult Gerontology Primary Care Nurse Practitioner [facility] 02/16/2018, 9:44 PM

## 2018-01-21 NOTE — Patient Instructions (Addendum)
Tomma RakersSharon D Purkey,   Thank you for coming in to clinic today.  1. You have arthritis.  Your right shoulder exam is normal today.  Muscle strain is normally going to improve in several days.  You may have had a muscle strain too. - Start taking Tylenol extra strength 1 to 2 tablets every 6-8 hours for aches or fever/chills for next few days as needed.  Do not take more than 3,000 mg in 24 hours from all medicines.  May take Ibuprofen as well if tolerated 200-400mg  every 8 hours as needed. May alternate tylenol and ibuprofen in same day. - Use heat and ice.  Apply this for 15 minutes at a time 6-8 times per day.   - lidocaine gel, lidocaine patch, Biofreeze, or tiger balm for topical pain relief.  Avoid using this with heat and ice to avoid burns.  Please schedule a follow-up appointment with Wilhelmina McardleLauren Vernor Monnig, AGNP. Return in about 6 months (around 07/23/2018) for asthma.  If you have any other questions or concerns, please feel free to call the clinic or send a message through MyChart. You may also schedule an earlier appointment if necessary.  You will receive a survey after today's visit either digitally by e-mail or paper by [employer] mail. Your experiences and feedback matter to us.  Please respond so we know how we are doing as we provide care for you.   Wilhelmina McardleLauren Briante Loveall, DNP, AGNP-BC Adult Gerontology Nurse Practitioner [facility], [facility]

## 2018-01-26 ENCOUNTER — Other Ambulatory Visit: Payer: Self-pay | Admitting: Nurse Practitioner

## 2018-01-26 DIAGNOSIS — J302 Other seasonal allergic rhinitis: Secondary | ICD-10-CM

## 2018-01-26 NOTE — Telephone Encounter (Signed)
Pt. Called requesting a refill on her acid reflux. Called into [facility]  [address] [place]. Pt  Call back # is [phone]

## 2018-01-26 NOTE — Telephone Encounter (Signed)
Acid reflux is not a previously diagnosed problem and there are no acid reflux medications on file.  Pt will need a visit.  She can use over the counter options prior to the next visit.

## 2018-01-27 NOTE — Telephone Encounter (Signed)
The pt states she took omeprazole in the past it was prescribed by Bjorn PippinAmy Krebs, NP. I found this information in [facility] Omeprazole 20MG , 1 (one) Capsule DR daily, by Amy Krebs, FNP-C.  She states after she eats she notices a burning feeling in her chest and it worse with lying down after eating. She states the symptoms approved and then started to come back and now it worse. Please advise

## 2018-01-28 MED ORDER — OMEPRAZOLE 20 MG PO CPDR: 20.0000 mg | DELAYED_RELEASE_CAPSULE | Freq: Every day | ORAL | 3 refills | Status: DC

## 2018-02-16 ENCOUNTER — Encounter: Payer: Self-pay | Admitting: Nurse Practitioner

## 2018-06-09 ENCOUNTER — Emergency Department: Payer: Medicaid Other

## 2018-06-09 ENCOUNTER — Emergency Department
Admission: EM | Admit: 2018-06-09 | Discharge: 2018-06-09 | Disposition: A | Discharge status: Home / Self Care | Payer: Medicaid Other | Attending: Emergency Medicine | Admitting: Emergency Medicine

## 2018-06-09 ENCOUNTER — Encounter: Payer: Self-pay | Admitting: Emergency Medicine

## 2018-06-09 DIAGNOSIS — S93401A Sprain of unspecified ligament of right ankle, initial encounter: Secondary | ICD-10-CM | POA: Diagnosis not present

## 2018-06-09 DIAGNOSIS — I2510 Atherosclerotic heart disease of native coronary artery without angina pectoris: Secondary | ICD-10-CM | POA: Diagnosis not present

## 2018-06-09 DIAGNOSIS — Y92009 Unspecified place in unspecified non-institutional (private) residence as the place of occurrence of the external cause: Secondary | ICD-10-CM | POA: Insufficient documentation

## 2018-06-09 DIAGNOSIS — W109XXA Fall (on) (from) unspecified stairs and steps, initial encounter: Secondary | ICD-10-CM | POA: Insufficient documentation

## 2018-06-09 DIAGNOSIS — F17210 Nicotine dependence, cigarettes, uncomplicated: Secondary | ICD-10-CM | POA: Insufficient documentation

## 2018-06-09 DIAGNOSIS — Y999 Unspecified external cause status: Secondary | ICD-10-CM | POA: Diagnosis not present

## 2018-06-09 DIAGNOSIS — Y9301 Activity, walking, marching and hiking: Secondary | ICD-10-CM | POA: Insufficient documentation

## 2018-06-09 DIAGNOSIS — Z79899 Other long term (current) drug therapy: Secondary | ICD-10-CM | POA: Insufficient documentation

## 2018-06-09 DIAGNOSIS — I252 Old myocardial infarction: Secondary | ICD-10-CM | POA: Insufficient documentation

## 2018-06-09 DIAGNOSIS — S99911A Unspecified injury of right ankle, initial encounter: Secondary | ICD-10-CM | POA: Diagnosis present

## 2018-06-09 DIAGNOSIS — J45909 Unspecified asthma, uncomplicated: Secondary | ICD-10-CM | POA: Insufficient documentation

## 2018-06-09 MED ORDER — OXYCODONE-ACETAMINOPHEN 5-325 MG PO TABS
1.0000 | ORAL_TABLET | Freq: Once | ORAL | Status: AC
  Administered 2018-06-09: 1 via ORAL
  Filled 2018-06-09: qty 1

## 2018-06-09 MED ORDER — OXYCODONE-ACETAMINOPHEN 5-325 MG PO TABS
1.0000 | ORAL_TABLET | ORAL | 0 refills | Status: DC | PRN
Start: 2018-06-09 — End: 2018-07-27

## 2018-06-09 NOTE — ED Provider Notes (Signed)
[facility] Emergency Department Provider Note  ____________________________________________  Time seen: Approximately 2:08 PM  I have reviewed the triage vital signs and the nursing notes.   HISTORY  Chief Complaint Ankle Pain    HPI Connie Moreno is a 57 y.o. female that presents to the  emergency department for evaluation of ankle pain for 1 day.  Patient was walking up into her house when she missed the step and her ankle rolled.  She has been taking Aleve without relief.  She has not been able to bear weight since incident.  No additional injuries.  She had ankle surgery several years ago.   Past Medical History:  Diagnosis Date  . Arthritis   . Asthma   . Coronary artery disease   . Hepatitis   . MI (myocardial infarction) [facility](HCC)    age in her mid 420's  . Vitamin D deficiency     Patient Active Problem List   Diagnosis Date Noted  . 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 Nocturnal leg cramps 05/26/2016  . Vitamin D deficiency 05/26/2016  . Chronic knee pain 05/26/2016  . Smoker 11/24/2014  . SOB (shortness of breath) 11/24/2014  . Asthma, moderate persistent 11/24/2014  . Substance abuse in remission (HCC) 11/24/2014  . Bilateral leg edema 11/24/2014    Past Surgical History:  Procedure Laterality Date  . ANKLE SURGERY     metal plate and screws in right ankle.   Marland Kitchen. KNEE SURGERY     bilateral knee surgery     Prior to Admission medications   Medication Sig Start Date End Date Taking? Authorizing Provider  albuterol (PROVENTIL HFA;VENTOLIN HFA) 108 (90 Base) MCG/ACT inhaler Inhale 2 puffs into the lungs every 6 (six) hours as needed for wheezing or shortness of breath. 01/21/18   Galen ManilaKennedy, Lauren Renee, NP  budesonide-formoterol [facility](SYMBICORT) 80-4.5 MCG/ACT inhaler Inhale 2 puffs into the lungs 2 (two) times daily. 01/21/18   Galen ManilaKennedy, Lauren Renee, NP  Cholecalciferol (VITAMIN D) 2000 units CAPS Take 2,000 Units by  mouth daily.    [provider]  lidocaine (XYLOCAINE) 2 % jelly Apply 1 application topically as needed. 01/21/18   Galen ManilaKennedy, Lauren Renee, NP  montelukast (SINGULAIR) 10 MG tablet Take 1 tablet (10 mg total) by mouth at bedtime. 01/21/18   Galen ManilaKennedy, Lauren Renee, NP  omeprazole (PRILOSEC) 20 MG capsule Take 1 capsule (20 mg total) by mouth daily. 01/28/18   Galen ManilaKennedy, Lauren Renee, NP  oxyCODONE-acetaminophen (PERCOCET) 5-325 MG tablet Take 1 tablet by mouth every 4 (four) hours as needed for severe pain. 06/09/18 06/09/19  Enid DerryWagner, Geoffery Aultman, PA-C    Allergies Patient has no known allergies.  Family History  Problem Relation Age of Onset  . Hypertension Brother   . Hypertension Brother     Social History Social History   Tobacco Use  . Smoking status: Current Every Day Smoker    Packs/day: 0.50    Years: 39.00    Pack years: 19.50    Types: Cigarettes  . Smokeless tobacco: Current User  Substance Use Topics  . Alcohol use: Yes    Comment: Drinks a 40 oz. daily.   . Drug use: Yes    Types: Marijuana, Cocaine    Comment: Used cocaine and marijuana for 6 years. Has quit the use of these drugs.      Review of Systems  Gastrointestinal: No nausea, no vomiting.  Musculoskeletal: Positive for ankle pain. Skin: Negative for rash, abrasions, lacerations,  ecchymosis.  ____________________________________________   PHYSICAL EXAM:  VITAL SIGNS: ED Triage Vitals  Enc Vitals Group     BP 06/09/18 1328 122/66     Pulse Rate 06/09/18 1328 90     Resp 06/09/18 1328 16     Temp 06/09/18 1328 98.4 F (36.9 C)     Temp Source 06/09/18 1328 Oral     SpO2 06/09/18 1328 97 %     Weight 06/09/18 1326 145 lb (65.8 kg)     Height 06/09/18 1326 5\' 6"  (1.676 m)     Head Circumference --      Peak Flow --      Pain Score 06/09/18 1326 10     Pain Loc --      Pain Edu? --      Excl. in GC? --      Constitutional: Alert and oriented. Well appearing and in no acute distress. Eyes:  Conjunctivae are normal. PERRL. EOMI. Head: Atraumatic. ENT:      Ears:      Nose: No congestion/rhinnorhea.      Mouth/Throat: Mucous membranes are moist.  Neck: No stridor. Cardiovascular: Normal rate, regular rhythm.  Good peripheral circulation.  Symmetric dorsalis pedis pulses bilaterally. Respiratory: Normal respiratory effort without tachypnea or retractions. Lungs CTAB. Good air entry to the bases with no decreased or absent breath sounds. Musculoskeletal: Full range of motion to all extremities. No gross deformities appreciated.  Tenderness to palpation and swelling of her medial malleolus.  Neurologic:  Normal speech and language. No gross focal neurologic deficits are appreciated.  Skin:  Skin is warm, dry and intact. No rash noted. Psychiatric: Mood and affect are normal. Speech and behavior are normal. Patient exhibits appropriate insight and judgement.   ____________________________________________   LABS (all labs ordered are listed, but only abnormal results are displayed)  Labs Reviewed - No data to display ____________________________________________  EKG   ____________________________________________  RADIOLOGY Lexine Baton, personally viewed and evaluated these images (plain radiographs) as part of my medical decision making, as well as reviewing the written report by the radiologist.  Dg Ankle Complete Right  Result Date: 06/09/2018 CLINICAL DATA:  Pain following twisting injury EXAM: RIGHT ANKLE - COMPLETE 3+ VIEW COMPARISON:  October 08, 2014 FINDINGS: Frontal, oblique, and lateral views were obtained. There is screw and plate fixation in the distal fibula with alignment in this area anatomic. There is no acute fracture or joint effusion. Calcification in the medial malleolar region is well corticated and may represent residua of old trauma. 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 There is subchondral cystic change in the talus which also may be indicative of a degree of osteochondritis dissecans. Ankle mortise appears grossly intact. IMPRESSION: Postoperative change involving the fibula. 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 The overall appearance is stable compared to 2015 study. No acute fracture. Suspect old trauma medial malleolar region. Ankle mortise appears grossly intact. Electronically Signed   By: Bretta Bang III M.D.   On: 06/09/2018 13:46    ____________________________________________    PROCEDURES  Procedure(s) performed:    Procedures    Medications  oxyCODONE-acetaminophen (PERCOCET/ROXICET) 5-325 MG per tablet 1 tablet (1 tablet Oral Given 06/09/18 1422)     ____________________________________________   INITIAL IMPRESSION / ASSESSMENT AND PLAN / ED COURSE  Pertinent labs & imaging results that were available during my care of  the patient were reviewed by me and considered in my medical decision making (see chart for details).  Review of the [place] CSRS was performed in accordance of the [facility] prior to dispensing any controlled drugs.   Patient's diagnosis is consistent with ankle sprain.  Vital signs and exam are reassuring.  Ankle x-ray shows chronic changes.  Findings were discussed with patient and she was given a copy of her results.  She is agreeable to follow-up with orthopedics.  Ankle splint was placed and crutches were given.  Patient will be discharged home with prescriptions for a short course of Percocet. Patient is to follow up with orthopedics as directed. Patient is given ED precautions to return to the ED for any worsening or new symptoms.    ____________________________________________  FINAL CLINICAL IMPRESSION(S) / ED DIAGNOSES  Final diagnoses:  Sprain of right ankle, unspecified ligament, initial encounter      NEW  MEDICATIONS STARTED DURING THIS VISIT:  ED Discharge Orders         Ordered    oxyCODONE-acetaminophen (PERCOCET) 5-325 MG tablet  Every 4 hours PRN     06/09/18 1452              This chart was dictated using voice recognition software/Dragon. Despite best efforts to proofread, errors can occur which can change the meaning. Any change was purely unintentional.    Enid DerryWagner, Burak Zerbe, PA-C 06/09/18 1540    Rockne MenghiniNorman, Anne-Caroline, MD 06/10/18 2105

## 2018-06-09 NOTE — ED Triage Notes (Signed)
Pt reports last pm twisted her right ankle walking up steps, now painful to walk on.

## 2018-07-27 ENCOUNTER — Emergency Department: Payer: Medicaid Other

## 2018-07-27 ENCOUNTER — Other Ambulatory Visit: Payer: Self-pay

## 2018-07-27 ENCOUNTER — Ambulatory Visit: Payer: Medicaid Other | Admitting: Nurse Practitioner

## 2018-07-27 ENCOUNTER — Encounter: Payer: Self-pay | Admitting: Emergency Medicine

## 2018-07-27 ENCOUNTER — Emergency Department
Admission: EM | Admit: 2018-07-27 | Discharge: 2018-07-27 | Disposition: A | Discharge status: Home / Self Care | Payer: Medicaid Other | Attending: Emergency Medicine | Admitting: Emergency Medicine

## 2018-07-27 DIAGNOSIS — Y939 Activity, unspecified: Secondary | ICD-10-CM | POA: Diagnosis not present

## 2018-07-27 DIAGNOSIS — Y999 Unspecified external cause status: Secondary | ICD-10-CM | POA: Diagnosis not present

## 2018-07-27 DIAGNOSIS — S8011XA Contusion of right lower leg, initial encounter: Secondary | ICD-10-CM

## 2018-07-27 DIAGNOSIS — S40011A Contusion of right shoulder, initial encounter: Secondary | ICD-10-CM

## 2018-07-27 DIAGNOSIS — W010XXA Fall on same level from slipping, tripping and stumbling without subsequent striking against object, initial encounter: Secondary | ICD-10-CM | POA: Insufficient documentation

## 2018-07-27 DIAGNOSIS — J45909 Unspecified asthma, uncomplicated: Secondary | ICD-10-CM | POA: Diagnosis not present

## 2018-07-27 DIAGNOSIS — Z79899 Other long term (current) drug therapy: Secondary | ICD-10-CM | POA: Insufficient documentation

## 2018-07-27 DIAGNOSIS — S93401A Sprain of unspecified ligament of right ankle, initial encounter: Secondary | ICD-10-CM | POA: Diagnosis not present

## 2018-07-27 DIAGNOSIS — F17210 Nicotine dependence, cigarettes, uncomplicated: Secondary | ICD-10-CM | POA: Diagnosis not present

## 2018-07-27 DIAGNOSIS — I2510 Atherosclerotic heart disease of native coronary artery without angina pectoris: Secondary | ICD-10-CM | POA: Diagnosis not present

## 2018-07-27 DIAGNOSIS — Y92009 Unspecified place in unspecified non-institutional (private) residence as the place of occurrence of the external cause: Secondary | ICD-10-CM | POA: Diagnosis not present

## 2018-07-27 DIAGNOSIS — S8991XA Unspecified injury of right lower leg, initial encounter: Secondary | ICD-10-CM | POA: Diagnosis present

## 2018-07-27 DIAGNOSIS — W19XXXA Unspecified fall, initial encounter: Secondary | ICD-10-CM

## 2018-07-27 MED ORDER — NAPROXEN 500 MG PO TABS
500.0000 mg | ORAL_TABLET | Freq: Once | ORAL | Status: AC
  Administered 2018-07-27: 500 mg via ORAL
  Filled 2018-07-27: qty 1

## 2018-07-27 MED ORDER — NAPROXEN 500 MG PO TABS: 500.0000 mg | ORAL_TABLET | Freq: Two times a day (BID) | ORAL | 0 refills | Status: DC

## 2018-07-27 NOTE — ED Provider Notes (Signed)
[facility] Emergency Department Provider Note  ____________________________________________   First MD Initiated Contact with Patient 07/27/18 1009     (approximate)  I have reviewed the triage vital signs and the nursing notes.   HISTORY  Chief Complaint Fall  HPI Connie Moreno is a 57 y.o. female presents to the ED via EMS after she tripped over a drop cord inside her house this morning.  Patient states there was no loss of consciousness and she did not hit her head.  She complains of right shoulder pain, right knee pain and ankle pain.  Patient has a history of prior injury to her right ankle and had internal fixation surgery years ago.  She states that each time she has an injury she aggravates her ankle.  She does not currently see her orthopedist.  His pain as an 8 out of 10.  Past Medical History:  Diagnosis Date  . Arthritis   . Asthma   . Coronary artery disease   . Hepatitis   . MI (myocardial infarction) [facility])    age in her mid 42's  . Vitamin D deficiency     Patient Active Problem List   Diagnosis Date Noted  . 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 Nocturnal leg cramps 05/26/2016  . Vitamin D deficiency 05/26/2016  . Chronic knee pain 05/26/2016  . Smoker 11/24/2014  . SOB (shortness of breath) 11/24/2014  . Asthma, moderate persistent 11/24/2014  . Substance abuse in remission (HCC) 11/24/2014  . Bilateral leg edema 11/24/2014    Past Surgical History:  Procedure Laterality Date  . ANKLE SURGERY     metal plate and screws in right ankle.   Marland Kitchen KNEE SURGERY     bilateral knee surgery     Prior to Admission medications   Medication Sig Start Date End Date Taking? Authorizing Provider  albuterol (PROVENTIL HFA;VENTOLIN HFA) 108 (90 Base) MCG/ACT inhaler Inhale 2 puffs into the lungs every 6 (six) hours as needed for wheezing or shortness of breath. 01/21/18   Galen Manila, NP    budesonide-formoterol [facility]) 80-4.5 MCG/ACT inhaler Inhale 2 puffs into the lungs 2 (two) times daily. 01/21/18   Galen Manila, NP  Cholecalciferol (VITAMIN D) 2000 units CAPS Take 2,000 Units by mouth daily.    [provider]  lidocaine (XYLOCAINE) 2 % jelly Apply 1 application topically as needed. 01/21/18   Galen Manila, NP  montelukast (SINGULAIR) 10 MG tablet Take 1 tablet (10 mg total) by mouth at bedtime. 01/21/18   Galen Manila, NP  naproxen (NAPROSYN) 500 MG tablet Take 1 tablet (500 mg total) by mouth 2 (two) times daily with a meal. 07/27/18   Tommi Rumps, PA-C  omeprazole (PRILOSEC) 20 MG capsule Take 1 capsule (20 mg total) by mouth daily. 01/28/18   Galen Manila, NP    Allergies Patient has no known allergies.  Family History  Problem Relation Age of Onset  . Hypertension Brother   . Hypertension Brother     Social History Social History   Tobacco Use  . Smoking status: Current Every Day Smoker    Packs/day: 0.50    Years: 39.00    Pack years: 19.50    Types: Cigarettes  . Smokeless tobacco: Current User  Substance Use Topics  . Alcohol use: Yes    Comment: Drinks a 40 oz. daily.   . Drug use: Yes    Types: Marijuana, Cocaine  Comment: Used cocaine and marijuana for 6 years. Has quit the use of these drugs.     Review of Systems Constitutional: No fever/chills Eyes: No visual changes. ENT: No trauma. Cardiovascular: Denies chest pain. Respiratory: Denies shortness of breath. Gastrointestinal: No abdominal pain.  No nausea, no vomiting. Musculoskeletal: Positive for right knee pain shoulder pain and ankle pain. Skin: Negative for rash. Neurological: Negative for headaches, focal weakness or numbness. ____________________________________________   PHYSICAL EXAM:  VITAL SIGNS: ED Triage Vitals  Enc Vitals Group     BP 07/27/18 1004 133/65     Pulse Rate 07/27/18 1004 63     Resp 07/27/18 1004 20      Temp 07/27/18 1004 (!) 89 F (31.7 C)     Temp Source 07/27/18 1004 Oral     SpO2 07/27/18 1004 98 %     Weight 07/27/18 1002 150 lb (68 kg)     Height 07/27/18 1002 5\' 7"  (1.702 m)     Head Circumference --      Peak Flow --      Pain Score 07/27/18 1001 8     Pain Loc --      Pain Edu? --      Excl. in GC? --    Constitutional: Alert and oriented. Well appearing and in no acute distress. Eyes: Conjunctivae are normal. PERRL. EOMI. Head: Atraumatic. Nose: No trauma. Neck: No stridor.  Nontender cervical spine to palpation posteriorly. Cardiovascular: Normal rate, regular rhythm. Grossly normal heart sounds.  Good peripheral circulation. Respiratory: Normal respiratory effort.  No retractions. Lungs CTAB. Gastrointestinal: Soft and nontender. No distention. Musculoskeletal: On examination of the right lower extremity there is no gross deformity however there is chronic changes noted to her right ankle.  There is no appreciated soft tissue swelling in relationship to today's injury.  There is diffuse tenderness on palpation of the anterior knee without effusion.  Range of motion is restricted secondary to discomfort.  Skin is intact.  No discoloration is appreciated.  Motor sensory function intact distal to the injury. Neurologic:  Normal speech and language. No gross focal neurologic deficits are appreciated.  Skin:  Skin is warm, dry and intact.  Psychiatric: Mood and affect are normal. Speech and behavior are normal.  ____________________________________________   LABS (all labs ordered are listed, but only abnormal results are displayed)  Labs Reviewed - No data to display  RADIOLOGY  ED MD interpretation:   Right tib-fib is negative for acute bony injury.  Official radiology report(s): Dg Tibia/fibula Right  Result Date: 07/27/2018 CLINICAL DATA:  Right lower leg pain due to a trip and fall over a cord. Initial encounter. EXAM: RIGHT TIBIA AND FIBULA - 2 VIEW COMPARISON:   Plain films right ankle 06/09/2018. Plain films right knee 01/07/2016 FINDINGS: No acute bony or joint abnormality is identified. The patient is status post fixation of a healed lateral malleolar fracture. 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 There is also osteoarthritis about the knee which appears most notable in the lateral compartment. Soft tissues are unremarkable. IMPRESSION: No acute abnormality. Knee and tibiotalar osteoarthritis. Healed distal fibular fracture with fixation hardware in place. Electronically Signed   By: Drusilla Kanner M.D.   On: 07/27/2018 11:37    ____________________________________________   PROCEDURES  Procedure(s) performed: None  Procedures  Critical Care performed: No  ____________________________________________   INITIAL IMPRESSION / ASSESSMENT AND PLAN / ED COURSE  As part of my medical decision making, I reviewed the following  data within the electronic MEDICAL RECORD NUMBER Notes from prior ED visits and  Controlled Substance Database  Patient presents to the ED with complaint of right shoulder, knee and ankle pain after tripping over a cord and falling inside her house this morning.  She denies any head injury or loss of consciousness.  X-rays were negative for acute bony injury.  Patient has had surgery on her right ankle.  She was given a prescription for naproxen 500 mg twice daily with food.  She is to follow-up with her PCP if any continued problems.  She is encouraged to ice and elevate her knee and ankle if needed for swelling or pain. ____________________________________________   FINAL CLINICAL IMPRESSION(S) / ED DIAGNOSES  Final diagnoses:  Contusion of right lower leg, initial encounter  Contusion of right shoulder, initial encounter  Fall in home, initial encounter  Sprain of right ankle, unspecified ligament, initial encounter     ED Discharge Orders         Ordered    naproxen (NAPROSYN) 500 MG  tablet  2 times daily with meals     07/27/18 1156           Note:  This document was prepared using Dragon voice recognition software and may include unintentional dictation errors.    Tommi Rumps, PA-C 07/27/18 1608    Jene Every, MD 07/31/18 [phone]

## 2018-07-27 NOTE — ED Triage Notes (Signed)
Presents via ems  States she tripped over a drop cord  Landed on right side   Having pain to right knee,shoulder and ankle

## 2018-07-27 NOTE — Discharge Instructions (Signed)
Follow-up with your primary care provider if any continued problems.  Ice and elevate your ankle as needed for pain or swelling.  Wear the Ace wrap for added support.  Use crutches for weightbearing for the next 2 to 3 days.  Begin taking naproxen 500 mg twice daily with food.  You should follow-up with your primary care provider if any continued pain medication is needed.

## 2019-01-06 ENCOUNTER — Emergency Department: Payer: Medicaid Other

## 2019-01-06 ENCOUNTER — Encounter: Payer: Self-pay | Admitting: Emergency Medicine

## 2019-01-06 ENCOUNTER — Other Ambulatory Visit: Payer: Self-pay

## 2019-01-06 ENCOUNTER — Emergency Department
Admission: EM | Admit: 2019-01-06 | Discharge: 2019-01-06 | Disposition: A | Discharge status: Home / Self Care | Payer: Medicaid Other | Attending: Emergency Medicine | Admitting: Emergency Medicine

## 2019-01-06 DIAGNOSIS — Y999 Unspecified external cause status: Secondary | ICD-10-CM | POA: Diagnosis not present

## 2019-01-06 DIAGNOSIS — M542 Cervicalgia: Secondary | ICD-10-CM | POA: Diagnosis not present

## 2019-01-06 DIAGNOSIS — Y92410 Unspecified street and highway as the place of occurrence of the external cause: Secondary | ICD-10-CM | POA: Diagnosis not present

## 2019-01-06 DIAGNOSIS — Y9389 Activity, other specified: Secondary | ICD-10-CM | POA: Insufficient documentation

## 2019-01-06 DIAGNOSIS — R51 Headache: Secondary | ICD-10-CM | POA: Diagnosis not present

## 2019-01-06 DIAGNOSIS — M25551 Pain in right hip: Secondary | ICD-10-CM | POA: Diagnosis not present

## 2019-01-06 DIAGNOSIS — I2510 Atherosclerotic heart disease of native coronary artery without angina pectoris: Secondary | ICD-10-CM | POA: Diagnosis not present

## 2019-01-06 DIAGNOSIS — M79604 Pain in right leg: Secondary | ICD-10-CM | POA: Diagnosis not present

## 2019-01-06 DIAGNOSIS — M79601 Pain in right arm: Secondary | ICD-10-CM | POA: Diagnosis not present

## 2019-01-06 DIAGNOSIS — Z79899 Other long term (current) drug therapy: Secondary | ICD-10-CM | POA: Diagnosis not present

## 2019-01-06 DIAGNOSIS — T07XXXA Unspecified multiple injuries, initial encounter: Secondary | ICD-10-CM | POA: Diagnosis present

## 2019-01-06 DIAGNOSIS — J4540 Moderate persistent asthma, uncomplicated: Secondary | ICD-10-CM | POA: Diagnosis not present

## 2019-01-06 DIAGNOSIS — F17210 Nicotine dependence, cigarettes, uncomplicated: Secondary | ICD-10-CM | POA: Diagnosis not present

## 2019-01-06 MED ORDER — MELOXICAM 15 MG PO TABS: 15.0000 mg | ORAL_TABLET | Freq: Every day | ORAL | 0 refills | Status: DC

## 2019-01-06 MED ORDER — HYDROCODONE-ACETAMINOPHEN 5-325 MG PO TABS
1.0000 | ORAL_TABLET | Freq: Once | ORAL | Status: AC
Start: 2019-01-06 — End: 2019-01-06
  Administered 2019-01-06: 1 via ORAL
  Filled 2019-01-06: qty 1

## 2019-01-06 MED ORDER — METHOCARBAMOL 500 MG PO TABS: 500.0000 mg | ORAL_TABLET | Freq: Four times a day (QID) | ORAL | 0 refills | Status: DC
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
## 2019-05-04 ENCOUNTER — Ambulatory Visit (INDEPENDENT_AMBULATORY_CARE_PROVIDER_SITE_OTHER): Payer: Medicaid Other | Admitting: Family Medicine

## 2019-05-04 ENCOUNTER — Other Ambulatory Visit: Payer: Self-pay

## 2019-05-04 ENCOUNTER — Telehealth: Payer: Self-pay | Admitting: *Deleted

## 2019-05-04 ENCOUNTER — Encounter: Payer: Self-pay | Admitting: Family Medicine

## 2019-05-04 DIAGNOSIS — Z20822 Contact with and (suspected) exposure to covid-19: Secondary | ICD-10-CM

## 2019-05-04 DIAGNOSIS — Z20828 Contact with and (suspected) exposure to other viral communicable diseases: Secondary | ICD-10-CM

## 2019-05-04 NOTE — Telephone Encounter (Signed)
Parks Ranger, Devonne Doughty, DO  [facility] [employer] [facility] [facility]   58 y.o., 1961-05-06, F  MRN: [id number]   [phone] (Mobile)   No MyChart   Virtual visit today 05/04/19. Exposure to possible COVID19 a patient who was quarantined. Unconfirmed if positive. Patient has asthma is high risk, has some dyspnea and reduced energy. Request testing in [place].   Nobie Putnam, [facility]  05/04/2019, 10:09 AM

## 2019-05-04 NOTE — Telephone Encounter (Signed)
Spoke with patient.  Advised her that no appointment is needed for COVID 19 test at testing sites.  She plans to go for test today at [facility] between now and 3:30 pm.  Testing protocol reviewed.  Order placed.

## 2019-05-04 NOTE — Patient Instructions (Signed)
AVS given by phone. 

## 2019-05-04 NOTE — Progress Notes (Signed)
Virtual Visit via Telephone The purpose of this virtual visit is to provide medical care while limiting exposure to the novel coronavirus (COVID19) for both patient and office staff.  Consent was obtained for phone visit:  Yes.   Answered questions that patient had about telehealth interaction:  Yes.   I discussed the limitations, risks, security and privacy concerns of performing an evaluation and management service by telephone. I also discussed with the patient that there may be a patient responsible charge related to this service. The patient expressed understanding and agreed to proceed.  Patient Location: Home Provider Location: [facility])   ---------------------------------------------------------------------- Chief Complaint  Patient presents with  . Coronavirus Exposure    possible COVID 19 exposure     S: Reviewed CMA documentation. I have called patient and gathered additional HPI as follows:  [place] Patient states that someone visited her house about 5 days ago, and they were supposed to be in quarantine, pending test, she is still not sure if they were positive. - Admits some chronic fatigue and feels now reduced energy and admits some shortness of breath at baseline she has history of asthma and usually has some symptoms - She has been around other family member who were tested negative.  Denies any high risk travel to areas of current concern for COVID19.  Denies any fevers, chills, sweats, body ache, cough, sinus pain or pressure, headache, abdominal pain, diarrhea  -------------------------------------------------------------------------- O: No physical exam performed due to remote telephone encounter.  -------------------------------------------------------------------------- A&P:  POSSIBLE EXPOSURE TO COVID19 Concern with some fatigue reduced energy - Reassuring without high risk symptoms - Afebrile - Comorbid pulmonary  conditions (asthma). No COPD or immunocompromise  Ordered COVID19 testing through [place] testing pool - they will contact patient via telephone to proceed with arranging date/time testing. OTC medications recommended for symptoms. Recommend Tylenol PRN for fever or other viral symptoms  No orders of the defined types were placed in this encounter.   REQUIRED self quarantine to [place] - advised to avoid all exposure with others while during TESTING (Pending result). Should continue to quarantine for up to 7-14 days - pending resolution of symptoms, if TEST IS NEGATIVE and symptoms resolve by 7 days and is afebrile >3 days - may STOP self quarantine at that time. IF test is POSITIVE then will require 10 additional day quarantine after date of positive test result.   If symptoms do not resolve or significantly improve OR if WORSENING - fever / cough - or worsening shortness of breath - then should contact us and seek advice on next steps in treatment at home vs where/when to seek care at Urgent Care or Hospital ED for further intervention and possible testing if indicated.  Patient verbalizes understanding with the above medical recommendations including the limitation of remote medical advice.  Specific follow-up / call-back criteria were given for patient to follow-up or seek medical care more urgently if needed.   - Time spent in direct consultation with patient on phone: 7 minutes  Nobie Putnam, [facility] 05/04/2019, 10:02 AM

## 2019-05-09 LAB — NOVEL CORONAVIRUS, NAA: SARS-CoV-2, NAA: NOT DETECTED

## 2019-05-12 ENCOUNTER — Telehealth: Payer: Self-pay | Admitting: Nurse Practitioner

## 2019-05-12 ENCOUNTER — Other Ambulatory Visit: Payer: Self-pay | Admitting: Nurse Practitioner

## 2019-05-12 NOTE — Telephone Encounter (Signed)
Appointment scheduled.

## 2019-05-12 NOTE — Telephone Encounter (Signed)
Message from RN call center that patient needs refills on nebulizer, inhaler, singulair, and lidocaine patches to [facility]. It has been more than one year since these problems have been addressed.  Connie Moreno needs to schedule an appointment (telehealth okay).

## 2019-05-13 ENCOUNTER — Ambulatory Visit (INDEPENDENT_AMBULATORY_CARE_PROVIDER_SITE_OTHER): Payer: Medicaid Other | Admitting: Nurse Practitioner

## 2019-05-13 ENCOUNTER — Encounter: Payer: Self-pay | Admitting: Nurse Practitioner

## 2019-05-13 ENCOUNTER — Other Ambulatory Visit: Payer: Self-pay

## 2019-05-13 DIAGNOSIS — K219 Gastro-esophageal reflux disease without esophagitis: Secondary | ICD-10-CM

## 2019-05-13 DIAGNOSIS — J302 Other seasonal allergic rhinitis: Secondary | ICD-10-CM

## 2019-05-13 DIAGNOSIS — J4540 Moderate persistent asthma, uncomplicated: Secondary | ICD-10-CM

## 2019-05-13 MED ORDER — OMEPRAZOLE 20 MG PO CPDR: 20.0000 mg | DELAYED_RELEASE_CAPSULE | Freq: Every day | ORAL | 5 refills | Status: DC

## 2019-05-13 MED ORDER — MONTELUKAST SODIUM 10 MG PO TABS: 10.0000 mg | ORAL_TABLET | Freq: Every day | ORAL | 3 refills | Status: DC

## 2019-05-13 MED ORDER — BUDESONIDE-FORMOTEROL FUMARATE 80-4.5 MCG/ACT IN AERO: 2.0000 | INHALATION_SPRAY | Freq: Two times a day (BID) | RESPIRATORY_TRACT | 3 refills | Status: AC

## 2019-05-13 MED ORDER — ALBUTEROL SULFATE HFA 108 (90 BASE) MCG/ACT IN AERS: 2.0000 | INHALATION_SPRAY | Freq: Four times a day (QID) | RESPIRATORY_TRACT | 3 refills | Status: DC | PRN

## 2019-05-13 NOTE — Progress Notes (Signed)
Telemedicine Encounter: Disclosed to patient at start of encounter that we will provide appropriate telemedicine services.  Patient consents to be treated via phone prior to discussion. - Patient is at her home and is accessed via telephone. - Services are provided by Wilhelmina McardleLauren Elianys Conry from [facility].  Subjective:    Patient ID: Connie Moreno, female    DOB: 02/08/1961, 58 y.o.   MRN: [id number]  Connie Moreno is a 58 y.o. female presenting on 05/13/2019 for Back Pain (Would like Lidocaine Patches), Extremity Weakness (Patient's stating that her legs are becoming weak and "giving out"), Allergies (Needs Medication Refills), Asthma, and Gastroesophageal Reflux  HPI Asthma Patient has reduced smoking from 1ppd to 1 pack per 3 days.  Patient is using distraction.  Patient is out of her asthma inhaler.  Now having trouble with breathing. Patient is using Symbicort twice daily.  Patient is able to walk and play with her grandbabies some.  Has to sit and rest.  Can plant 3-4 plants before resting.  GERD Had god control for several weeks, but has been out of omeprazole for 3 days.  Is unable to lie down.  Feels burning in throat or chest. She reports no n/v, coffee ground emesis, dark/black/tarry stool, BRBPR, or other GI bleeding.   Patient is willing to defer leg weakness and back pain to in person visit.  Discussed that we are unable to address all problems today and patient verbalizes understanding.  Social History   Tobacco Use  . Smoking status: Current Every Day Smoker    Packs/day: 0.50    Years: 39.00    Pack years: 19.50    Types: Cigarettes  . Smokeless tobacco: Former Engineer, waterUser  Substance Use Topics  . Alcohol use: Yes    Alcohol/week: 40.0 standard drinks    Types: 40 Cans of beer per week    Comment: Drinks a 40 oz. daily or every other day.  . Drug use: Not Currently    Types: Marijuana, Cocaine    Comment: Used cocaine and marijuana for 6 years. Has  quit the use of these drugs.     Review of Systems Per HPI unless specifically indicated above     Objective:    There were no vitals taken for this visit.  Wt Readings from Last 3 Encounters:  01/06/19 155 lb (70.3 kg)  07/27/18 150 lb (68 kg)  06/09/18 145 lb (65.8 kg)    Physical Exam Patient remotely monitored.  Verbal communication appropriate.  Cognition normal.   Results for orders placed or performed in visit on 05/04/19  Novel Coronavirus, NAA ([facility])  Result Value Ref Range   SARS-CoV-2, NAA Not Detected Not Detected      Assessment & Plan:   Problem List Items Addressed This Visit      Respiratory   Asthma, moderate persistent Currently fairly well controlled.  Control of her symptoms when out of medications.  Patient continues to smoke cigarettes which complicates asthma control.  Plan: 1.  Continue Symbicort, Ventolin, Singulair.  Refills provided 2.  Consider repeat spirometry to determine if there is any COPD component. 3.  Follow-up 3 to 6 months.   Relevant Medications   budesonide-formoterol (SYMBICORT) 80-4.5 MCG/ACT inhaler   montelukast (SINGULAIR) 10 MG tablet   albuterol (VENTOLIN HFA) 108 (90 Base) MCG/ACT inhaler    Other Visit Diagnoses    Gastroesophageal reflux disease, esophagitis presence not specified    -  Primary Well-controlled on medications.  Patient  taking PPI omeprazole 20 mg once daily and no complications of bleeding.  Continue omeprazole 20 mg once daily.  Refills provided today.  Follow-up 3 to 6 months.   Relevant Medications   omeprazole (PRILOSEC) 20 MG capsule   Seasonal allergies     Well-controlled on Singulair.  Patient currently having respiratory difficulties.  Continue montelukast 10 mg once daily refills provided.  Follow-up 6 months.   Relevant Medications   montelukast (SINGULAIR) 10 MG tablet      Meds ordered this encounter  Medications  . budesonide-formoterol (SYMBICORT) 80-4.5 MCG/ACT inhaler    Sig:  Inhale 2 puffs into the lungs 2 (two) times daily.    Dispense:  1 Inhaler    Refill:  3    Order Specific Question:   Supervising Provider    Answer:   Olin Hauser [[id number]]  . montelukast (SINGULAIR) 10 MG tablet    Sig: Take 1 tablet (10 mg total) by mouth at bedtime.    Dispense:  30 tablet    Refill:  3    Order Specific Question:   Supervising Provider    Answer:   Olin Hauser [[id number]]  . albuterol (VENTOLIN HFA) 108 (90 Base) MCG/ACT inhaler    Sig: Inhale 2 puffs into the lungs every 6 (six) hours as needed for wheezing or shortness of breath.    Dispense:  18 g    Refill:  3    Order Specific Question:   Supervising Provider    Answer:   Olin Hauser [[id number]]  . omeprazole (PRILOSEC) 20 MG capsule    Sig: Take 1 capsule (20 mg total) by mouth daily.    Dispense:  30 capsule    Refill:  5    Order Specific Question:   Supervising Provider    Answer:   Olin Hauser [[id number]]   - Time spent in direct consultation with patient via telemedicine about above concerns: 7 minutes  Follow up plan: Return in about 2 weeks (around 05/27/2019) for leg weakness and back pain.  Cassell Smiles, DNP, AGPCNP-BC Adult Gerontology Primary Care Nurse Practitioner [facility] 05/13/2019, 11:30 AM

## 2019-05-19 ENCOUNTER — Ambulatory Visit (INDEPENDENT_AMBULATORY_CARE_PROVIDER_SITE_OTHER): Payer: Medicaid Other | Admitting: Nurse Practitioner

## 2019-05-19 ENCOUNTER — Encounter: Payer: Self-pay | Admitting: Nurse Practitioner

## 2019-05-19 ENCOUNTER — Other Ambulatory Visit: Payer: Self-pay

## 2019-05-19 VITALS — BP 149/70 | HR 66 | Ht 67.0 in | Wt 132.0 lb

## 2019-05-19 DIAGNOSIS — M25561 Pain in right knee: Secondary | ICD-10-CM | POA: Diagnosis not present

## 2019-05-19 DIAGNOSIS — M5441 Lumbago with sciatica, right side: Secondary | ICD-10-CM

## 2019-05-19 DIAGNOSIS — M25562 Pain in left knee: Secondary | ICD-10-CM

## 2019-05-19 DIAGNOSIS — M25571 Pain in right ankle and joints of right foot: Secondary | ICD-10-CM | POA: Diagnosis not present

## 2019-05-19 DIAGNOSIS — G8929 Other chronic pain: Secondary | ICD-10-CM

## 2019-05-19 MED ORDER — DICLOFENAC SODIUM 75 MG PO TBEC: 75.0000 mg | DELAYED_RELEASE_TABLET | Freq: Two times a day (BID) | ORAL | 2 refills | Status: DC

## 2019-05-19 MED ORDER — TIZANIDINE HCL 2 MG PO CAPS: 2.0000 mg | ORAL_CAPSULE | Freq: Three times a day (TID) | ORAL | 0 refills | Status: DC | PRN

## 2019-05-19 NOTE — Patient Instructions (Addendum)
Connie Moreno,   Thank you for coming in to clinic today.  1. 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  You may also have low back arthritis.  We will start with xrays.  You may need a spine MRI.  - Start taking Tylenol extra strength 1 to 2 tablets every 6-8 hours for aches or fever/chills for next few days as needed.  Do not take more than 3,000 mg in 24 hours from all medicines.   - Start diclofenac 75 mg one tablet twice daily - Use heat and ice.  Apply this for 15 minutes at a time 6-8 times per day.   - Muscle rub with lidocaine, lidocaine patch, Biofreeze, or tiger balm for topical pain relief.  Avoid using this with heat and ice to avoid burns.  Please schedule a follow-up appointment with Cassell Smiles, AGNP. Return 4-6 weeks if symptoms worsen or fail to improve.  If you have any other questions or concerns, please feel free to call the clinic or send a message through [place]. You may also schedule an earlier appointment if necessary.  You will receive a survey after today's visit either digitally by e-mail or paper by [employer]. Your experiences and feedback matter to [place].  Please respond so we know how we are doing as we provide care for you.   Cassell Smiles, DNP, AGNP-BC Adult Gerontology Nurse Practitioner [place]

## 2019-05-19 NOTE — Progress Notes (Addendum)
Subjective:    Patient ID: Connie RakersSharon D Moreno, female    DOB: 06/20/1961, 58 y.o.   MRN: [id number]  Connie Moreno is a 58 y.o. female presenting on 05/19/2019 for Back Pain (constant mid -lower back pain that radiates around the back x 2 days), Leg Pain (Rt knee and ankle pain with intermittent swelling ), and Weight Loss  HPI Back Pain When she sits down she has pain "pressing against her spine that shoots up her back."  Pain is intermittent, but worse over last 2 days.  Is affecting sleep.  Patient reports pain is also radiating around to the front of her lower abdomen in to groin.  Patient reports she had to slide on the floor on a towel to get to the bathroom and was unable to stand up.  She is freely moving within her seat at this time.  Pain is affecting her breathing she states. Patient states pain is a 10 right now, but is not visibly short of breath.    Leg pain Right ankle pain (metal plate and 7 screws).  Foot swells, then has bruising on top of foot.  Patient notes her knee and ankle "constantly swell."  Weight loss Patient notes when she is hurting badly, she doesn't want to get up and eat.  Patient reports she has no help, but does call for friends to bring her food occasionally.  Neighbor checks on her daily. Patient has started drinking Ensure a couple of days ago, also has Boost.  Social History   Tobacco Use  . Smoking status: Current Every Day Smoker    Packs/day: 0.50    Years: 39.00    Pack years: 19.50    Types: Cigarettes  . Smokeless tobacco: Former Engineer, waterUser  Substance Use Topics  . Alcohol use: Yes    Alcohol/week: 40.0 standard drinks    Types: 40 Cans of beer per week    Comment: Drinks a 40 oz. daily or every other day.  . Drug use: Not Currently    Types: Marijuana, Cocaine    Comment: Used cocaine and marijuana for 6 years. Has quit the use of these drugs.     Review of Systems Per HPI unless specifically indicated above     Objective:    BP (!)  149/70 (BP Location: Left Arm, Patient Position: Sitting, Cuff Size: Normal)   Pulse 66   Ht 5\' 7"  (1.702 m)   Wt 132 lb (59.9 kg)   BMI 20.67 kg/m   Wt Readings from Last 3 Encounters:  05/19/19 132 lb (59.9 kg)  01/06/19 155 lb (70.3 kg)  07/27/18 150 lb (68 kg)    Physical Exam Vitals signs reviewed.  Constitutional:      General: She is not in acute distress.    Appearance: She is well-developed.  HENT:     Head: Normocephalic and atraumatic.  Cardiovascular:     Rate and Rhythm: Normal rate and regular rhythm.     Pulses:          Radial pulses are 2+ on the right side and 2+ on the left side.       Posterior tibial pulses are 1+ on the right side and 1+ on the left side.     Heart sounds: Normal heart sounds, S1 normal and S2 normal.  Pulmonary:     Effort: Pulmonary effort is normal. No respiratory distress.     Breath sounds: Normal breath sounds and air entry.  Musculoskeletal:  Right lower leg: No edema.     Left lower leg: No edema.     Comments: Low Back Inspection: Normal appearance, normal body habitus, no spinal deformity, symmetrical. Palpation: No tenderness over spinous processes. Bilateral thoracic and lumbar paraspinal muscles tender and with hypertonicity ROM: Limited AROM forward flex / back extension, rotation L/R with discomfort. Patient was able to bend forward in chair to tie her shoe after removing it to evaluate her foot/ankle. Special Testing: Seated SLR negative for radicular pain bilaterally, but with reproduced localized midline back pain  Strength: Bilateral hip flex/ext 3/5, knee flex/ext 4/5, ankle dorsiflex/plantarflex 4/5 Neurovascular: intact distal sensation to light touch  Bilateral Knees Inspection: Right knee swelling and effusion noted. No ecchymosis. Palpation: Right knee tender throughout. Left knee tender only to transverse joint line. Crepitus present. ROM: Limited knee flexion bilaterally Special Testing: Lachman /  Valgus/Varus tests were unable to be performed due to positioning difficulties with back pain Strength: 4/5 intact knee flex/ext, ankle dorsi/plantarflex Neurovascular: distally intact sensation light touch and pulses  LEFT ankle Inspection: medial malleolus with swelling, lateral malleolus with visible screw protrusions from prior surgery Palpation: Mildly tender to palpation around full joint ROM: limited dorsiflexion, plantarflexion.  Significantly limited inversion/eversion Special Testing: none Strength: 4/5 Neurovascular: intact sensation to light touch, good cap refill.  Skin:    General: Skin is warm and dry.     Capillary Refill: Capillary refill takes less than 2 seconds.  Neurological:     Mental Status: She is alert and oriented to person, place, and time.  Psychiatric:        Attention and Perception: Attention normal.        Mood and Affect: Mood and affect normal.        Behavior: Behavior normal. Behavior is cooperative.     Results for orders placed or performed in visit on 05/04/19  Novel Coronavirus, NAA ([facility])  Result Value Ref Range   SARS-CoV-2, NAA Not Detected Not Detected      Assessment & Plan:   Problem List Items Addressed This Visit      Nervous and Auditory   Midline low back pain with right-sided sciatica - Primary   Relevant Medications   diclofenac (VOLTAREN) 75 MG EC tablet   tizanidine (ZANAFLEX) 2 MG capsule   Other Relevant Orders   Ambulatory referral to Orthopedic Surgery   DG Lumbar Spine Complete     Other   Chronic knee pain   Relevant Medications   diclofenac (VOLTAREN) 75 MG EC tablet   tizanidine (ZANAFLEX) 2 MG capsule   Other Relevant Orders   DG Knee Complete 4 Views Left   Ambulatory referral to Orthopedic Surgery   DG Knee Complete 4 Views Right    Other Visit Diagnoses    Chronic pain of right ankle       Relevant Medications   diclofenac (VOLTAREN) 75 MG EC tablet   tizanidine (ZANAFLEX) 2 MG capsule    Other Relevant Orders   Ambulatory referral to Orthopedic Surgery    Acute on Chronic midline low back pain.  Concern for possible misuse of medications in past and currently, but physical exam supports patient's complaints today.  Patient with likely arthritis of spine, but cannot exclude disc injury with complaints of feeling increased spinal pressure and with sciatica symptoms.  Plan: 1. Complete workup with imaging.  Xray series ordered.  May need MRI, but would likely need to complete physical therapy and medical therapy for 6 weeks  prior.  Referral to neurosurgery if needed after imaging. 2. START diclofenac 75 mg one tablet twice daily prn.  Take with food.  STOP meloxicam and all other NSAIDs 3. Considered prednisone taper, but will defer at this time.  4. START tizanidine 2 mg tid prn muscle spasms for next 15 days.   5. Referral orthopedics for additional evaluation.  Patient refuses physical therapy at this time. 6. May use heat for muscle release, can consider tennis ball massage prn muscle release 7. 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  Worsening pain with crepitus and limited joint mobility. - Treat pain with same methods as above for back pain. - Xrays of bilateral knees ordered - Referral orthopedics, recommended physical therapy but was refused - Follow-up 4-6 weeks   Meds ordered this encounter  Medications  . diclofenac (VOLTAREN) 75 MG EC tablet    Sig: Take 1 tablet (75 mg total) by mouth 2 (two) times daily.    Dispense:  30 tablet    Refill:  2    Order Specific Question:   Supervising Provider    Answer:   Olin Hauser [[id number]]  . tizanidine (ZANAFLEX) 2 MG capsule    Sig: Take 1 capsule (2 mg total) by mouth 3 (three) times daily as needed for muscle spasms.    Dispense:  45 capsule    Refill:  0    Order Specific Question:   Supervising Provider     Answer:   Olin Hauser [[id number]]    Follow up plan: Return 4-6 weeks if symptoms worsen or fail to improve.  Cassell Smiles, DNP, AGPCNP-BC Adult Gerontology Primary Care Nurse Practitioner [facility] 05/19/2019, 10:03 AM   --------------------------------------------------------  Additional supporting documentation:  Based on clinical evaluation performed on 05/19/19 and their diagnoses:  Chronic Mid Back Pain with Sciatica M54.41 Chronic Right Knee Pain M25.561 Chronic Left Knee Pain M25.562 Chronic Right Ankle Pain M25.571  It is medically necessary for patient to have DME to benefit their daily function and mobility due to chronic pain described above.  Order seated walker for ambulation to improve function, due to pain and fall risk, improve ability to do ADLs at home.  Order commode over the toilet chair - to assist with transition to toilet due to chronic pain to help ADLs.  I am the supervising physician for Cassell Smiles, AGPCNP-BC and present when the patient had their office visit. I have reviewed this encounter including the documentation in this note and/or discussed this patient with the provider, Cassell Smiles, AGPCNP-BC. I am certifying that I agree with the content of this note as supervising physician.  Nobie Putnam, DO [facility] 08/18/2019, 9:18 AM

## 2019-07-25 ENCOUNTER — Telehealth: Payer: Self-pay | Admitting: Nurse Practitioner

## 2019-07-25 NOTE — Telephone Encounter (Signed)
Pt.Connie Moreno with  [facility] called  [phone]..... requesting a seat walker for the pt to be called into  [name].  Cyril Mourning  Is need a copy that will deliver to her house.

## 2019-07-25 NOTE — Telephone Encounter (Signed)
[place] [facility] Social Worker [facility]   Called and LVM to return call with additional information about why patient is needing the walker.  Patient has not had significant gait problems on last evaluations here in clinic.

## 2019-09-25 ENCOUNTER — Emergency Department
Admission: EM | Admit: 2019-09-25 | Discharge: 2019-09-25 | Disposition: A | Discharge status: Home / Self Care | Payer: Medicaid Other | Attending: Emergency Medicine | Admitting: Emergency Medicine

## 2019-09-25 ENCOUNTER — Emergency Department: Payer: Medicaid Other

## 2019-09-25 ENCOUNTER — Encounter: Payer: Self-pay | Admitting: Emergency Medicine

## 2019-09-25 DIAGNOSIS — Z79899 Other long term (current) drug therapy: Secondary | ICD-10-CM | POA: Diagnosis not present

## 2019-09-25 DIAGNOSIS — R0602 Shortness of breath: Secondary | ICD-10-CM | POA: Diagnosis not present

## 2019-09-25 DIAGNOSIS — F17210 Nicotine dependence, cigarettes, uncomplicated: Secondary | ICD-10-CM | POA: Diagnosis not present

## 2019-09-25 DIAGNOSIS — I2510 Atherosclerotic heart disease of native coronary artery without angina pectoris: Secondary | ICD-10-CM | POA: Insufficient documentation

## 2019-09-25 DIAGNOSIS — J45909 Unspecified asthma, uncomplicated: Secondary | ICD-10-CM | POA: Insufficient documentation

## 2019-09-25 DIAGNOSIS — U071 COVID-19: Secondary | ICD-10-CM | POA: Insufficient documentation

## 2019-09-25 DIAGNOSIS — I252 Old myocardial infarction: Secondary | ICD-10-CM | POA: Diagnosis not present

## 2019-09-25 DIAGNOSIS — R0600 Dyspnea, unspecified: Secondary | ICD-10-CM

## 2019-09-25 LAB — COMPREHENSIVE METABOLIC PANEL
ALT: 42 U/L (ref 0–44)
AST: 50 U/L — ABNORMAL HIGH (ref 15–41)
Albumin: 3.9 g/dL (ref 3.5–5.0)
Alkaline Phosphatase: 82 U/L (ref 38–126)
Anion gap: 12 (ref 5–15)
BUN: 8 mg/dL (ref 6–20)
CO2: 22 mmol/L (ref 22–32)
Calcium: 9.2 mg/dL (ref 8.9–10.3)
Chloride: 103 mmol/L (ref 98–111)
Creatinine, Ser: 0.78 mg/dL (ref 0.44–1.00)
GFR calc Af Amer: >60 mL/min (ref >60)
GFR calc non Af Amer: >60 mL/min (ref >60)
Glucose, Bld: 106 mg/dL — ABNORMAL HIGH (ref 70–99)
Potassium: 3.8 mmol/L (ref 3.5–5.1)
Sodium: 137 mmol/L (ref 135–145)
Total Bilirubin: 0.9 mg/dL (ref 0.3–1.2)
Total Protein: 7.9 g/dL (ref 6.5–8.1)

## 2019-09-25 LAB — URINALYSIS, COMPLETE (UACMP) WITH MICROSCOPIC
Bacteria, UA: NONE SEEN
Bilirubin Urine: NEGATIVE
Glucose, UA: NEGATIVE mg/dL
Hgb urine dipstick: NEGATIVE
Ketones, ur: NEGATIVE mg/dL
Leukocytes,Ua: NEGATIVE
Nitrite: NEGATIVE
Protein, ur: NEGATIVE mg/dL
Specific Gravity, Urine: 1.017 (ref 1.005–1.030)
pH: 7 (ref 5.0–8.0)

## 2019-09-25 LAB — CBC WITH DIFFERENTIAL/PLATELET
Abs Immature Granulocytes: 0.02 10*3/uL (ref 0.00–0.07)
Basophils Absolute: 0 10*3/uL (ref 0.0–0.1)
Basophils Relative: 1 %
Eosinophils Absolute: 0 10*3/uL (ref 0.0–0.5)
Eosinophils Relative: 0 %
HCT: 44.8 % (ref 36.0–46.0)
Hemoglobin: 15.2 g/dL — ABNORMAL HIGH (ref 12.0–15.0)
Immature Granulocytes: 1 %
Lymphocytes Relative: 62 %
Lymphs Abs: 2.6 10*3/uL (ref 0.7–4.0)
MCH: 31.5 pg (ref 26.0–34.0)
MCHC: 33.9 g/dL (ref 30.0–36.0)
MCV: 92.9 fL (ref 80.0–100.0)
Monocytes Absolute: 0.3 10*3/uL (ref 0.1–1.0)
Monocytes Relative: 8 %
Neutro Abs: 1.1 10*3/uL — ABNORMAL LOW (ref 1.7–7.7)
Neutrophils Relative %: 28 %
Platelets: 141 10*3/uL — ABNORMAL LOW (ref 150–400)
RBC: 4.82 MIL/uL (ref 3.87–5.11)
RDW: 11.9 % (ref 11.5–15.5)
WBC: 4.1 10*3/uL (ref 4.0–10.5)
nRBC: 0 % (ref 0.0–0.2)

## 2019-09-25 LAB — LACTIC ACID, PLASMA
Lactic Acid, Venous: 2.5 mmol/L (ref 0.5–1.9)
Lactic Acid, Venous: 0.9 mmol/L (ref 0.5–1.9)

## 2019-09-25 LAB — TROPONIN I (HIGH SENSITIVITY)
Troponin I (High Sensitivity): 3 ng/L (ref <18)
Troponin I (High Sensitivity): 4 ng/L (ref <18)

## 2019-09-25 MED ORDER — ACETAMINOPHEN 325 MG PO TABS
650.0000 mg | ORAL_TABLET | Freq: Once | ORAL | Status: AC
  Administered 2019-09-25: 650 mg via ORAL
  Filled 2019-09-25: qty 2

## 2019-09-25 MED ORDER — IOHEXOL 350 MG/ML SOLN
75.0000 mL | Freq: Once | INTRAVENOUS | Status: AC | PRN
  Administered 2019-09-25: 14:00:00 75 mL via INTRAVENOUS

## 2019-09-25 MED ORDER — SODIUM CHLORIDE 0.9 % IV BOLUS
1000.0000 mL | Freq: Once | INTRAVENOUS | Status: AC
  Administered 2019-09-25: 13:00:00 1000 mL via INTRAVENOUS

## 2019-09-25 NOTE — ED Provider Notes (Signed)
[facility] Emergency Department Provider Note   ____________________________________________   First MD Initiated Contact with Patient 09/25/19 1017     (approximate)  I have reviewed the triage vital signs and the nursing notes.   HISTORY  Chief Complaint COVID Positive    HPI Connie Moreno is a 58 y.o. female who complains of aching all over cough shortness of breath and chills.  She got sick on the second had a Covid test done and was called yesterday and told it was positive.  She does have a rattly cough.  Her O2 sats per EMS were 98/99% on room air here they were also 98/99% on room         Past Medical History:  Diagnosis Date  . Arthritis   . Asthma   . Coronary artery disease   . Hepatitis   . MI (myocardial infarction) [facility](HCC)    age in her mid 7920's  . Vitamin D deficiency     Patient Active Problem List   Diagnosis Date Noted  . 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 Nocturnal leg cramps 05/26/2016  . Vitamin D deficiency 05/26/2016  . Chronic knee pain 05/26/2016  . Smoker 11/24/2014  . SOB (shortness of breath) 11/24/2014  . Asthma, moderate persistent 11/24/2014  . Substance abuse in remission (HCC) 11/24/2014  . Bilateral leg edema 11/24/2014    Past Surgical History:  Procedure Laterality Date  . ANKLE SURGERY     metal plate and screws in right ankle.   Marland Kitchen. KNEE SURGERY     bilateral knee surgery     Prior to Admission medications   Medication Sig Start Date End Date Taking? Authorizing Provider  albuterol (VENTOLIN HFA) 108 (90 Base) MCG/ACT inhaler Inhale 2 puffs into the lungs every 6 (six) hours as needed for wheezing or shortness of breath. 05/13/19   Galen ManilaKennedy, Lauren Renee, NP  budesonide-formoterol (SYMBICORT) 80-4.5 MCG/ACT inhaler Inhale 2 puffs into the lungs 2 (two) times daily. 05/13/19   Galen ManilaKennedy, Lauren Renee, NP  Cholecalciferol (VITAMIN D) 2000 units CAPS Take 2,000 Units by  mouth daily.    [provider]  diclofenac (VOLTAREN) 75 MG EC tablet Take 1 tablet (75 mg total) by mouth 2 (two) times daily. 05/19/19   Galen ManilaKennedy, Lauren Renee, NP  methocarbamol (ROBAXIN) 500 MG tablet Take 1 tablet (500 mg total) by mouth 4 (four) times daily. Patient not taking: Reported on 05/19/2019 01/06/19   Cuthriell, Delorise RoyalsJonathan D, PA-C  montelukast (SINGULAIR) 10 MG tablet Take 1 tablet (10 mg total) by mouth at bedtime. 05/13/19   Galen ManilaKennedy, Lauren Renee, NP  omeprazole (PRILOSEC) 20 MG capsule Take 1 capsule (20 mg total) by mouth daily. 05/13/19   Galen ManilaKennedy, Lauren Renee, NP  tizanidine (ZANAFLEX) 2 MG capsule Take 1 capsule (2 mg total) by mouth 3 (three) times daily as needed for muscle spasms. 05/19/19   Galen ManilaKennedy, Lauren Renee, NP    Allergies Patient has no known allergies.  Family History  Problem Relation Age of Onset  . Hypertension Brother   . Hypertension Brother     Social History Social History   Tobacco Use  . Smoking status: Current Every Day Smoker    Packs/day: 0.50    Years: 39.00    Pack years: 19.50    Types: Cigarettes  . Smokeless tobacco: Former Engineer, waterUser  Substance Use Topics  . Alcohol use: Yes    Alcohol/week: 40.0 standard drinks    Types: 40  Cans of beer per week    Comment: Drinks a 40 oz. daily or every other day.  . Drug use: Not Currently    Types: Marijuana, Cocaine    Comment: Used cocaine and marijuana for 6 years. Has quit the use of these drugs.     Review of Systems  Constitutional:  fever/chills Eyes: No visual changes. ENT: No sore throat. Cardiovascular: Denies chest pain. Respiratory: Denies shortness of breath. Gastrointestinal:  abdominal pain same achy pain she has everywhere.  No nausea, no vomiting.  No diarrhea.  No constipation. Genitourinary: Negative for dysuria. Musculoskeletalback pain same achy pain she has everywhere. Skin: Negative for rash. Neurological: headaches, no focal weakness    ____________________________________________   PHYSICAL EXAM:  VITAL SIGNS: ED Triage Vitals [09/25/19 1027]  Enc Vitals Group     BP      Pulse      Resp      Temp      Temp src      SpO2 99 %     Weight      Height      Head Circumference      Peak Flow      Pain Score      Pain Loc      Pain Edu?      Excl. in [place]?     Constitutional: Alert and oriented.  Looks ill Eyes: Conjunctivae are normal.  Head: Atraumatic. Nose: No congestion/rhinnorhea. Mouth/Throat: Mucous membranes are moist.  Oropharynx non-erythematous. Neck: No stridor. Cardiovascular: Normal rate, regular rhythm. Grossly normal heart sounds.  Good peripheral circulation. Respiratory: Some increased respiratory effort.  No retractions. Lungs CTAB. Gastrointestinal: Soft and nontender. No distention. No abdominal bruits.  Musculoskeletal: No lower extremity tenderness nor edema.   Neurologic:  Normal speech and language. No gross focal neurologic deficits are appreciated.  Skin:  Skin is warm, dry and intact. No rash noted.   ____________________________________________   LABS (all labs ordered are listed, but only abnormal results are displayed)  Labs Reviewed  COMPREHENSIVE METABOLIC PANEL - Abnormal; Notable for the following components:      Result Value   Glucose, Bld 106 (*)    AST 50 (*)    All other components within normal limits  LACTIC ACID, PLASMA - Abnormal; Notable for the following components:   Lactic Acid, Venous 2.5 (*)    All other components within normal limits  CBC WITH DIFFERENTIAL/PLATELET - Abnormal; Notable for the following components:   Hemoglobin 15.2 (*)    Platelets 141 (*)    Neutro Abs 1.1 (*)    All other components within normal limits  URINALYSIS, COMPLETE (UACMP) WITH MICROSCOPIC - Abnormal; Notable for the following components:   Color, Urine YELLOW (*)    APPearance CLEAR (*)    All other components within normal limits  LACTIC ACID, PLASMA  TROPONIN  I (HIGH SENSITIVITY)  TROPONIN I (HIGH SENSITIVITY)   ____________________________________________  EKG   ____________________________________________  RADIOLOGY  ED MD interpretation: CT does not show any acute pathology pneumonia or blood clots or anything else.  Official radiology report(s): Ct Angio Chest Pe W And/or Wo Contrast  Result Date: 09/25/2019 CLINICAL DATA:  Shortness of breath, COVID positive, no fever, cough, generalized body aches, elevated D-dimer, question pulmonary embolism EXAM: CT ANGIOGRAPHY CHEST WITH CONTRAST TECHNIQUE: Multidetector CT imaging of the chest was performed using the standard protocol during bolus administration of intravenous contrast. Multiplanar CT image reconstructions and MIPs were obtained to evaluate  the vascular anatomy. CONTRAST:  32mL OMNIPAQUE IOHEXOL 350 MG/ML SOLN IV COMPARISON:  None FINDINGS: Cardiovascular: Scattered atherosclerotic calcifications aorta, proximal great vessels and coronary arteries. Aorta normal caliber without aneurysm or dissection. Heart unremarkable. No pericardial effusion. Pulmonary arteries adequately opacified and patent. No evidence of pulmonary embolism. Mediastinum/Nodes: Esophagus unremarkable. Base of cervical region normal appearance. Normal sized mediastinal and BILATERAL axillary lymph nodes. No definite thoracic adenopathy. Lungs/Pleura: Lungs clear. No pulmonary infiltrate, pleural effusion, pneumothorax or mass. Upper Abdomen: Visualized upper abdomen unremarkable. Musculoskeletal: Unremarkable Review of the MIP images confirms the above findings. IMPRESSION: No evidence of pulmonary embolism. No acute intrathoracic abnormalities. Atherosclerotic calcifications including coronary arteries. Aortic Atherosclerosis (ICD10-I70.0). Electronically Signed   By: Ulyses Southward M.D.   On: 09/25/2019 14:42   Dg Chest Portable 1 View  Result Date: 09/25/2019 CLINICAL DATA:  Worsening shortness of breath. COVID-19 virus  infection. EXAM: PORTABLE CHEST 1 VIEW COMPARISON:  01/06/2018 FINDINGS: The heart size and mediastinal contours are within normal limits. Aortic atherosclerosis. Both lungs are clear. The visualized skeletal structures are unremarkable. IMPRESSION: No active disease. Electronically Signed   By: Danae Orleans M.D.   On: 09/25/2019 11:21    ____________________________________________   PROCEDURES  Procedure(s) performed (including Critical Care):  Procedures   ____________________________________________   INITIAL IMPRESSION / ASSESSMENT AND PLAN / ED COURSE     Patient does not seem to be short of breath.  She has not had any wheezing.  CT shows no acute pathology.  She can stand without getting short of breath or hypoxic.  Patient reports she does not usually do much more than that anyway.  I will let her go.  I have told her to come back if she gets any worse at all.  I told her she might get worse with the Covid.  I have asked her to get some vitamin D to take.  She can of course use her inhaler.          ____________________________________________   FINAL CLINICAL IMPRESSION(S) / ED DIAGNOSES  Final diagnoses:  Dyspnea, unspecified type     ED Discharge Orders    None       Note:  This document was prepared using Dragon voice recognition software and may include unintentional dictation errors.    Arnaldo Natal, MD 09/25/19 [phone]

## 2019-09-25 NOTE — ED Notes (Signed)
EMS vitals BP 151/78, 98% RA, P 119. COVID Pos. Hx of Asthma, Bronchitis and c/o of cough.

## 2019-09-25 NOTE — ED Triage Notes (Signed)
Pt to ED by EMS with c/o of Cough, SOB and generalized body aches. Pt received positive COVID result yesterday. Pt's symptoms started on 12/2. Per EMS pt 98-99% on RA.

## 2019-09-25 NOTE — ED Notes (Signed)
Pt reports health dept called to follow up with her today and advised her to come to the ER due to her symptoms, [phone], health dept, verified that the patient had a positive test on dec 4th at [facility].

## 2019-09-25 NOTE — ED Notes (Signed)
Attempted to ambulate pt, pt states she doesn't walk frequently at home but uses a walker with a seat on it. Pt was able to stand using her cane with an SpO2 of 95%.

## 2019-09-25 NOTE — ED Notes (Signed)
MD Malinda at bedside. 

## 2019-09-25 NOTE — Discharge Instructions (Addendum)
Please get some vitamin D pills and take 3000 to 5000 units once a day.  Please use your inhaler.  Please return for high fever or more shortness of breath or feeling sicker at all.

## 2019-09-25 NOTE — ED Notes (Signed)
Pt given graham crackers,peanut butter and ginger ale. 

## 2019-09-25 NOTE — ED Notes (Signed)
Dr. Cinda Quest notified about lactic 2.5. Bolus ordered

## 2020-03-10 ENCOUNTER — Emergency Department: Payer: Medicaid Other

## 2020-03-10 ENCOUNTER — Other Ambulatory Visit: Payer: Self-pay

## 2020-03-10 ENCOUNTER — Encounter: Payer: Self-pay | Admitting: Emergency Medicine

## 2020-03-10 ENCOUNTER — Emergency Department
Admission: EM | Admit: 2020-03-10 | Discharge: 2020-03-10 | Disposition: A | Discharge status: Home / Self Care | Payer: Medicaid Other | Attending: Emergency Medicine | Admitting: Emergency Medicine

## 2020-03-10 DIAGNOSIS — J45909 Unspecified asthma, uncomplicated: Secondary | ICD-10-CM | POA: Diagnosis not present

## 2020-03-10 DIAGNOSIS — I2510 Atherosclerotic heart disease of native coronary artery without angina pectoris: Secondary | ICD-10-CM | POA: Diagnosis not present

## 2020-03-10 DIAGNOSIS — F17210 Nicotine dependence, cigarettes, uncomplicated: Secondary | ICD-10-CM | POA: Diagnosis not present

## 2020-03-10 DIAGNOSIS — Z79899 Other long term (current) drug therapy: Secondary | ICD-10-CM | POA: Diagnosis not present

## 2020-03-10 DIAGNOSIS — M25561 Pain in right knee: Secondary | ICD-10-CM | POA: Diagnosis not present

## 2020-03-10 MED ORDER — KETOROLAC TROMETHAMINE 30 MG/ML IJ SOLN
30.0000 mg | Freq: Once | INTRAMUSCULAR | Status: AC
  Administered 2020-03-10: 30 mg via INTRAMUSCULAR
  Filled 2020-03-10: qty 1

## 2020-03-10 MED ORDER — NAPROXEN 500 MG PO TABS: 500.0000 mg | ORAL_TABLET | Freq: Two times a day (BID) | ORAL | 0 refills | Status: DC

## 2020-03-10 MED ORDER — OXYCODONE-ACETAMINOPHEN 5-325 MG PO TABS
1.0000 | ORAL_TABLET | Freq: Once | ORAL | Status: AC
  Administered 2020-03-10: 1 via ORAL
  Filled 2020-03-10: qty 1

## 2020-03-10 MED ORDER — OXYCODONE-ACETAMINOPHEN 5-325 MG PO TABS: 1.0000 | ORAL_TABLET | Freq: Four times a day (QID) | ORAL | 0 refills | Status: DC | PRN

## 2020-03-10 NOTE — ED Notes (Signed)
See triage note presents with right knee pain  States she has had some chronic knee pain    States her knee gave out and she fell this am

## 2020-03-10 NOTE — ED Provider Notes (Signed)
[facility] Emergency Department Provider Note   ____________________________________________   First MD Initiated Contact with Patient 03/10/20 1141     (approximate)  I have reviewed the triage vital signs and the nursing notes.   HISTORY  Chief Complaint Knee Pain   HPI Connie Moreno is a 59 y.o. female presents to the ED with complaint of right knee pain.  Patient states that she has had a problem with her knee in the past but has not seen her orthopedist in the last 2 years.  She states that she was getting cortisone shots the last time she went.  She states that she took a friend's Percocet last evening along with some ibuprofen to get relief.  She states this morning her knee "gave out" and now she is in more pain.  Patient frequently uses a walker for added support.  She also has a history of fracture with open reduction to her right lower leg.  She rates her pain as a 10/10.      Past Medical History:  Diagnosis Date  . Arthritis   . Asthma   . Coronary artery disease   . Hepatitis   . MI (myocardial infarction) [facility])    age in her mid 3's  . Vitamin D deficiency     Patient Active Problem List   Diagnosis Date Noted  . 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 Nocturnal leg cramps 05/26/2016  . Vitamin D deficiency 05/26/2016  . Chronic knee pain 05/26/2016  . Smoker 11/24/2014  . SOB (shortness of breath) 11/24/2014  . Asthma, moderate persistent 11/24/2014  . Substance abuse in remission (HCC) 11/24/2014  . Bilateral leg edema 11/24/2014    Past Surgical History:  Procedure Laterality Date  . ANKLE SURGERY     metal plate and screws in right ankle.   Marland Kitchen KNEE SURGERY     bilateral knee surgery     Prior to Admission medications   Medication Sig Start Date End Date Taking? Authorizing Provider  albuterol (VENTOLIN HFA) 108 (90 Base) MCG/ACT inhaler Inhale 2 puffs into the lungs every 6 (six) hours as  needed for wheezing or shortness of breath. 05/13/19   Galen Manila, NP  budesonide-formoterol (SYMBICORT) 80-4.5 MCG/ACT inhaler Inhale 2 puffs into the lungs 2 (two) times daily. 05/13/19   Galen Manila, NP  Cholecalciferol (VITAMIN D) 2000 units CAPS Take 2,000 Units by mouth daily.    [provider]  diclofenac (VOLTAREN) 75 MG EC tablet Take 1 tablet (75 mg total) by mouth 2 (two) times daily. 05/19/19   Galen Manila, NP  montelukast (SINGULAIR) 10 MG tablet Take 1 tablet (10 mg total) by mouth at bedtime. 05/13/19   Galen Manila, NP  naproxen (NAPROSYN) 500 MG tablet Take 1 tablet (500 mg total) by mouth 2 (two) times daily with a meal. 03/10/20   Tommi Rumps, PA-C  omeprazole (PRILOSEC) 20 MG capsule Take 1 capsule (20 mg total) by mouth daily. 05/13/19   Galen Manila, NP  oxyCODONE-acetaminophen (PERCOCET) 5-325 MG tablet Take 1 tablet by mouth every 6 (six) hours as needed for severe pain. 03/10/20 03/10/21  Tommi Rumps, PA-C  tizanidine (ZANAFLEX) 2 MG capsule Take 1 capsule (2 mg total) by mouth 3 (three) times daily as needed for muscle spasms. 05/19/19   Galen Manila, NP    Allergies Patient has no known allergies.  Family History  Problem Relation Age  of Onset  . Hypertension Brother   . Hypertension Brother     Social History Social History   Tobacco Use  . Smoking status: Current Every Day Smoker    Packs/day: 0.50    Years: 39.00    Pack years: 19.50    Types: Cigarettes  . Smokeless tobacco: Former Network engineer Use Topics  . Alcohol use: Yes    Alcohol/week: 40.0 standard drinks    Types: 40 Cans of beer per week    Comment: Drinks a 40 oz. daily or every other day.  . Drug use: Not Currently    Types: Marijuana, Cocaine    Comment: Used cocaine and marijuana for 6 years. Has quit the use of these drugs.     Review of Systems Constitutional: No fever/chills Eyes: No visual changes. ENT:  No trauma. Cardiovascular: Denies chest pain. Respiratory: Denies shortness of breath. Gastrointestinal:   No nausea, no vomiting.  Musculoskeletal: Positive for right knee pain. Skin: Negative for rash. Neurological: Negative for headaches, focal weakness or numbness. ____________________________________________   PHYSICAL EXAM:  VITAL SIGNS: ED Triage Vitals  Enc Vitals Group     BP 03/10/20 1124 (!) 153/100     Pulse Rate 03/10/20 1124 95     Resp 03/10/20 1124 20     Temp 03/10/20 1124 99.1 F (37.3 C)     Temp Source 03/10/20 1124 Oral     SpO2 03/10/20 1124 99 %     Weight 03/10/20 1123 150 lb (68 kg)     Height 03/10/20 1123 5\' 7"  (1.702 m)     Head Circumference --      Peak Flow --      Pain Score 03/10/20 1123 10     Pain Loc --      Pain Edu? --      Excl. in [place]? --    Constitutional: Alert and oriented. Well appearing and in no acute distress.  Tearful during exam. Eyes: Conjunctivae are normal.  Head: Atraumatic. Neck: No stridor.   Cardiovascular: Normal rate, regular rhythm. Grossly normal heart sounds.  Good peripheral circulation. Respiratory: Normal respiratory effort.  No retractions. Lungs CTAB. Musculoskeletal: Right knee no gross deformity was noted.  There is no discoloration, erythema or abrasions noted.  Range of motion is markedly restricted secondary to patient's pain.  No crepitus was noted with range of motion.  No effusion noted.  Skin was intact. Neurologic:  Normal speech and language. No gross focal neurologic deficits are appreciated.  Skin:  Skin is warm, dry and intact. No rash noted. Psychiatric: Mood and affect are normal. Speech and behavior are normal.  ____________________________________________   LABS (all labs ordered are listed, but only abnormal results are displayed)  Labs Reviewed - No data to display  RADIOLOGY  Official radiology report(s): DG Knee Complete 4 Views Right  Result Date: 03/10/2020 CLINICAL DATA:   Right knee pain, post injury 1 week ago. EXAM: RIGHT KNEE - COMPLETE 4+ VIEW COMPARISON:  Right lower extremity radiograph January 06, 2019 FINDINGS: No evidence of acute fracture, dislocation, or joint effusion. Chronic osteoarthritic changes with subchondral sclerosis and irregularity of the tibial plateau, most pronounced in the lateral compartment, stable from the prior radiograph. Soft tissue thickening overlies the suprapatellar fossa. IMPRESSION: 1. No acute fracture or dislocation identified about the right knee. 2. Chronic osteoarthritic changes of the right knee. 3. Soft tissue thickening overlies the suprapatellar fossa may represent ligamentous/tendon injury. Electronically Signed   By: Thomas Hoff  Dimitrova M.D.   On: 03/10/2020 12:34    ____________________________________________   PROCEDURES  Procedure(s) performed (including Critical Care):  Procedures  Patient was placed in a knee immobilizer by nursing staff. ____________________________________________   INITIAL IMPRESSION / ASSESSMENT AND PLAN / ED COURSE  As part of my medical decision making, I reviewed the following data within the electronic MEDICAL RECORD NUMBER Notes from prior ED visits and [place] Controlled Substance Database  59 year old female presents to the ED with complaint of right knee pain.  Patient states she does have some chronic issues with her knee however she reports that her knee "gave out" causing her to fall against the wall this morning.  She denies any head injury or loss of consciousness.  X-rays were negative for any acute bony injury.  Complete range of motion was restricted secondary to patient's pain.  She was given Toradol 30 mg while in the ED along with Percocet.  A prescription for naproxen 500 mg and Percocet was written for the patient to continue taking.  She currently walks with a walker and a knee immobilizer was applied.  She is encouraged to ice and elevate to help with pain control.  She is to  follow-up with Dr. Allena Katz who is on-call for orthopedics and contact information was given to her.  ____________________________________________   FINAL CLINICAL IMPRESSION(S) / ED DIAGNOSES  Final diagnoses:  Right knee pain, unspecified chronicity     ED Discharge Orders         Ordered    oxyCODONE-acetaminophen (PERCOCET) 5-325 MG tablet  Every 6 hours PRN     03/10/20 1310    naproxen (NAPROSYN) 500 MG tablet  2 times daily with meals     03/10/20 1310           Note:  This document was prepared using Dragon voice recognition software and may include unintentional dictation errors.    Tommi Rumps, PA-C 03/10/20 1602    Sharman Cheek, MD 03/11/20 2212

## 2020-03-10 NOTE — ED Triage Notes (Signed)
Pt arrived via POV with reports of ongoing knee pain on the R, pt states last night the worsened and today she was using her walker and her knee gave out. Pt states the pain feels like pins are sticking in.  Pt taking percocet without relief.

## 2020-03-10 NOTE — Discharge Instructions (Signed)
Follow-up with your primary care provider or Dr. Allena Katz who is on-call for [facility] orthopedic department.  Ice and elevation today.  Wear knee immobilizer anytime you are up walking.  You do not have to wear it while in the bed.  Continue using your walker as you have in the past.  A prescription for naproxen and oxycodone with acetaminophen was sent to your pharmacy.

## 2020-03-10 NOTE — ED Triage Notes (Signed)
First nurse note- hurt knee one week ago, has had pain all week. Today when got up knee gave out from pain and pt fell.  Tearful.

## 2020-03-16 ENCOUNTER — Other Ambulatory Visit (HOSPITAL_COMMUNITY): Payer: Self-pay | Admitting: Orthopedic Surgery

## 2020-03-16 ENCOUNTER — Other Ambulatory Visit: Payer: Self-pay | Admitting: Orthopedic Surgery

## 2020-03-16 DIAGNOSIS — M2351 Chronic instability of knee, right knee: Secondary | ICD-10-CM

## 2020-03-16 DIAGNOSIS — M1711 Unilateral primary osteoarthritis, right knee: Secondary | ICD-10-CM

## 2020-03-16 DIAGNOSIS — M2391 Unspecified internal derangement of right knee: Secondary | ICD-10-CM

## 2020-04-02 ENCOUNTER — Ambulatory Visit
Admission: RE | Admit: 2020-04-02 | Discharge: 2020-04-02 | Disposition: A | Discharge status: Home / Self Care | Payer: Medicaid Other | Source: Ambulatory Visit | Attending: Orthopedic Surgery | Admitting: Orthopedic Surgery

## 2020-04-02 ENCOUNTER — Other Ambulatory Visit: Payer: Self-pay

## 2020-04-02 DIAGNOSIS — M1711 Unilateral primary osteoarthritis, right knee: Secondary | ICD-10-CM

## 2020-04-02 DIAGNOSIS — M2351 Chronic instability of knee, right knee: Secondary | ICD-10-CM | POA: Insufficient documentation

## 2020-04-02 DIAGNOSIS — M2391 Unspecified internal derangement of right knee: Secondary | ICD-10-CM | POA: Diagnosis present

## 2020-05-27 NOTE — Discharge Instructions (Signed)
Instructions after Total Knee Replacement   Connie Moreno, Jr., M.D.     Dept. of Orthopaedics & Sports Medicine  [facility]  [address]  [place], [place]  [address]  Phone: [phone]   Fax: [phone]    DIET: Drink plenty of non-alcoholic fluids. Resume your normal diet. Include foods high in fiber.  ACTIVITY:  You may use crutches or a walker with weight-bearing as tolerated, unless instructed otherwise. You may be weaned off of the walker or crutches by your Physical Therapist.  Do NOT place pillows under the knee. Anything placed under the knee could limit your ability to straighten the knee.   Continue doing gentle exercises. Exercising will reduce the pain and swelling, increase motion, and prevent muscle weakness.   Please continue to use the TED compression stockings for 6 weeks. You may remove the stockings at night, but should reapply them in the morning. Do not drive or operate any equipment until instructed.  WOUND CARE:  Continue to use the PolarCare or ice packs periodically to reduce pain and swelling. You may bathe or shower after the staples are removed at the first office visit following surgery.  MEDICATIONS: You may resume your regular medications. Please take the pain medication as prescribed on the medication. Do not take pain medication on an empty stomach. You have been given a prescription for a blood thinner (Lovenox or Coumadin). Please take the medication as instructed. (NOTE: After completing a 2 week course of Lovenox, take one Enteric-coated aspirin once a day. This along with elevation will help reduce the possibility of phlebitis in your operated leg.) Do not drive or drink alcoholic beverages when taking pain medications.  CALL THE OFFICE FOR: Temperature above 101 degrees Excessive bleeding or drainage on the dressing. Excessive swelling, coldness, or paleness of the toes. Persistent nausea and vomiting.  FOLLOW-UP:  You  should have an appointment to return to the office in 10-14 days after surgery. Arrangements have been made for continuation of Physical Therapy (either home therapy or outpatient therapy).   [facility] Department Directory         [email or web address]       [email or web address]          Cardiology  Appointments: [place] - [phone] [place] - [phone]  Endocrinology  Appointments: [place] - [phone] [place] - [phone]  Gastroenterology  Appointments: [place] - [phone] [place] - [phone]        General Surgery   Appointments: [place] - [phone]  Internal Medicine/Family Medicine  Appointments: [place] - [phone] [place] - [phone] [place] - [phone]  Metabolic and Weigh Loss Surgery  Appointments: [place] - [phone]        Neurology  Appointments: [place] - [phone] [place] - [phone]  Neurosurgery  Appointments: [place] - [phone]  Obstetrics & Gynecology  Appointments: [place] - [phone] [place] - [phone]        Pediatrics  Appointments: [place] - [phone] [place] - [phone]  Physiatry  Appointments: [place] -[phone]  Physical Therapy  Appointments: [place] - [phone] [place] - [phone]        Podiatry  Appointments: [place] - [phone] [place] - [phone]  Pulmonology  Appointments: [place] - [phone]  Rheumatology  Appointments: [place] - [phone]        [place] Location: [facility]  [address] [place], [place]  [address]  [place] Location: [facility] [address] [place], [place]  [address]  [place] Location: [facility] [address] [place], [place]  [address]    

## 2020-05-28 ENCOUNTER — Encounter: Payer: Self-pay | Admitting: Urgent Care

## 2020-05-28 ENCOUNTER — Other Ambulatory Visit
Admission: RE | Admit: 2020-05-28 | Discharge: 2020-05-28 | Disposition: A | Discharge status: Home / Self Care | Payer: Medicaid Other | Source: Ambulatory Visit | Attending: Orthopedic Surgery | Admitting: Orthopedic Surgery

## 2020-05-28 ENCOUNTER — Encounter: Payer: Self-pay | Admitting: Anesthesiology

## 2020-05-28 ENCOUNTER — Other Ambulatory Visit: Payer: Self-pay

## 2020-05-28 DIAGNOSIS — Z01810 Encounter for preprocedural cardiovascular examination: Secondary | ICD-10-CM

## 2020-05-28 DIAGNOSIS — Z01818 Encounter for other preprocedural examination: Secondary | ICD-10-CM | POA: Insufficient documentation

## 2020-05-28 HISTORY — DX: Depression, unspecified: F32.A

## 2020-05-28 LAB — COMPREHENSIVE METABOLIC PANEL
ALT: 44 U/L (ref 0–44)
AST: 43 U/L — ABNORMAL HIGH (ref 15–41)
Albumin: 4 g/dL (ref 3.5–5.0)
Alkaline Phosphatase: 112 U/L (ref 38–126)
Anion gap: 6 (ref 5–15)
BUN: 13 mg/dL (ref 6–20)
CO2: 30 mmol/L (ref 22–32)
Calcium: 9.3 mg/dL (ref 8.9–10.3)
Chloride: 104 mmol/L (ref 98–111)
Creatinine, Ser: 0.95 mg/dL (ref 0.44–1.00)
GFR calc Af Amer: >60 mL/min (ref >60)
GFR calc non Af Amer: >60 mL/min (ref >60)
Glucose, Bld: 88 mg/dL (ref 70–99)
Potassium: 4.2 mmol/L (ref 3.5–5.1)
Sodium: 140 mmol/L (ref 135–145)
Total Bilirubin: 0.7 mg/dL (ref 0.3–1.2)
Total Protein: 7.3 g/dL (ref 6.5–8.1)

## 2020-05-28 LAB — CBC
HCT: 42.3 % (ref 36.0–46.0)
Hemoglobin: 13.9 g/dL (ref 12.0–15.0)
MCH: 32.1 pg (ref 26.0–34.0)
MCHC: 32.9 g/dL (ref 30.0–36.0)
MCV: 97.7 fL (ref 80.0–100.0)
Platelets: 142 10*3/uL — ABNORMAL LOW (ref 150–400)
RBC: 4.33 MIL/uL (ref 3.87–5.11)
RDW: 12.4 % (ref 11.5–15.5)
WBC: 3 10*3/uL — ABNORMAL LOW (ref 4.0–10.5)
nRBC: 0 % (ref 0.0–0.2)

## 2020-05-28 LAB — URINALYSIS, ROUTINE W REFLEX MICROSCOPIC
Bilirubin Urine: NEGATIVE
Glucose, UA: NEGATIVE mg/dL
Hgb urine dipstick: NEGATIVE
Ketones, ur: NEGATIVE mg/dL
Leukocytes,Ua: NEGATIVE
Nitrite: NEGATIVE
Protein, ur: NEGATIVE mg/dL
Specific Gravity, Urine: 1.021 (ref 1.005–1.030)
pH: 5 (ref 5.0–8.0)

## 2020-05-28 LAB — SURGICAL PCR SCREEN
MRSA, PCR: NEGATIVE
Staphylococcus aureus: NEGATIVE

## 2020-05-28 LAB — TYPE AND SCREEN
ABO/RH(D): B POS
Antibody Screen: NEGATIVE

## 2020-05-28 LAB — C-REACTIVE PROTEIN: CRP: 1 mg/dL — ABNORMAL HIGH (ref <1.0)

## 2020-05-28 LAB — APTT: aPTT: 30 seconds (ref 24–36)

## 2020-05-28 LAB — PROTIME-INR
INR: 1 (ref 0.8–1.2)
Prothrombin Time: 12.3 seconds (ref 11.4–15.2)

## 2020-05-28 LAB — SEDIMENTATION RATE: Sed Rate: 9 mm/hr (ref 0–30)

## 2020-05-28 NOTE — Patient Instructions (Signed)
Your procedure is scheduled on: Mon 8/16 Report to Day Surgery. To find out your arrival time please call [phone] between 1PM - 3PM on Friday 8/13.  Remember: Instructions that are not followed completely may result in serious medical risk,  up to and including death, or upon the discretion of your surgeon and anesthesiologist your  surgery may need to be rescheduled.     _X__ 1. Do not eat food after midnight the night before your procedure.                 No gum chewing or hard candies. You may drink clear liquids up to 2 hours                 before you are scheduled to arrive for your surgery- DO not drink clear                 liquids within 2 hours of the start of your surgery.                 Clear Liquids include:  water, apple juice without pulp, clear Gatorade, G2 or                  Gatorade Zero (avoid Red/Purple/Blue), Black Coffee or Tea (Do not add                 anything to coffee or tea). ___x__2.   Complete the carbohydrate drink provided to you, 2 hours before arrival.  __X__2.  On the morning of surgery brush your teeth with toothpaste and water, you                may rinse your mouth with mouthwash if you wish.  Do not swallow any toothpaste of mouthwash.     _X__ 3.  No Alcohol for 24 hours before or after surgery.   _X__ 4.  Do Not Smoke or use e-cigarettes For 24 Hours Prior to Your Surgery.                 Do not use any chewable tobacco products for at least 6 hours prior to                 Surgery.  _X__  5.  Do not use any recreational drugs (marijuana, cocaine, heroin, ecstacy, MDMA or other)                For at least one week prior to your surgery.  Combination of these drugs with anesthesia                May have life threatening results.  ____  6.  Bring all medications with you on the day of surgery if instructed.   _x___  7.  Notify your doctor if there is any change in your medical condition      (cold, fever,  infections).     Do not wear jewelry, make-up, hairpins, clips or nail polish. Do not wear lotions, powders, or perfumes. You may wear deodorant. Do not shave 48 hours prior to surgery. Do not bring valuables to the hospital.    [facility] is not responsible for any belongings or valuables.  Contacts, dentures or bridgework may not be worn into surgery. Leave your suitcase in the car. After surgery it may be brought to your room. For patients admitted to the hospital, discharge time is determined by your treatment team.   Patients discharged the  day of surgery will not be allowed to drive home.   Make arrangements for someone to be with you for the first 24 hours of your Same Day Discharge.    Please read over the following fact sheets that you were given:   _x___ Take these medicines the morning of surgery with A SIP OF WATER:    1. omeprazole (PRILOSEC) 20 MG capsule  Night before and morning of surgery  2.   3.   4.  5.  6.  ____ Fleet Enema (as directed)   __x__ Use CHG Soap (or wipes) as directed  ____ Use Benzoyl Peroxide Gel as instructed  __x__ Use inhalers on the day of surgeryalbuterol (VENTOLIN HFA) 108 (90 Base) MCG/ACT inhaler and bring with you,budesonide-formoterol (SYMBICORT) 80-4.5 MCG/ACT inhaler  ____ Stop metformin 2 days prior to surgery    ____ Take 1/2 of usual insulin dose the night before surgery. No insulin the morning          of surgery.   ____ Stop Coumadin/Plavix/aspirin on   _x___ Stop Anti-inflammatories ibuprofen, aleve, naproxen and aspirin    May take 1000mg  of tylenol every 6 hours, use ice to painful areas and can use topical creams for pain.  Do not use the morning of surgery.   ____ Stop supplements until after surgery.    ____ Bring C-Pap to the hospital.

## 2020-05-29 LAB — URINE CULTURE
Culture: <10000 — AB
Special Requests: NORMAL

## 2020-05-30 ENCOUNTER — Other Ambulatory Visit: Payer: Medicaid Other

## 2020-05-31 ENCOUNTER — Other Ambulatory Visit: Payer: Self-pay

## 2020-05-31 ENCOUNTER — Other Ambulatory Visit
Admission: RE | Admit: 2020-05-31 | Discharge: 2020-05-31 | Disposition: A | Discharge status: Home / Self Care | Payer: Medicaid Other | Source: Ambulatory Visit | Attending: Orthopedic Surgery | Admitting: Orthopedic Surgery

## 2020-05-31 DIAGNOSIS — Z20822 Contact with and (suspected) exposure to covid-19: Secondary | ICD-10-CM | POA: Insufficient documentation

## 2020-05-31 DIAGNOSIS — Z01812 Encounter for preprocedural laboratory examination: Secondary | ICD-10-CM | POA: Insufficient documentation

## 2020-05-31 LAB — SARS CORONAVIRUS 2 (TAT 6-24 HRS): SARS Coronavirus 2: NEGATIVE

## 2020-06-03 ENCOUNTER — Encounter: Payer: Self-pay | Admitting: Orthopedic Surgery

## 2020-06-03 DIAGNOSIS — B1910 Unspecified viral hepatitis B without hepatic coma: Secondary | ICD-10-CM | POA: Insufficient documentation

## 2020-06-03 DIAGNOSIS — K759 Inflammatory liver disease, unspecified: Secondary | ICD-10-CM | POA: Insufficient documentation

## 2020-06-03 DIAGNOSIS — M179 Osteoarthritis of knee, unspecified: Secondary | ICD-10-CM | POA: Insufficient documentation

## 2020-06-03 DIAGNOSIS — F329 Major depressive disorder, single episode, unspecified: Secondary | ICD-10-CM | POA: Insufficient documentation

## 2020-06-03 DIAGNOSIS — I519 Heart disease, unspecified: Secondary | ICD-10-CM | POA: Insufficient documentation

## 2020-06-03 DIAGNOSIS — J45909 Unspecified asthma, uncomplicated: Secondary | ICD-10-CM | POA: Insufficient documentation

## 2020-06-03 DIAGNOSIS — I252 Old myocardial infarction: Secondary | ICD-10-CM | POA: Insufficient documentation

## 2020-06-03 DIAGNOSIS — M1710 Unilateral primary osteoarthritis, unspecified knee: Secondary | ICD-10-CM | POA: Insufficient documentation

## 2020-06-03 DIAGNOSIS — F32A Depression, unspecified: Secondary | ICD-10-CM | POA: Insufficient documentation

## 2020-06-03 DIAGNOSIS — M19019 Primary osteoarthritis, unspecified shoulder: Secondary | ICD-10-CM | POA: Insufficient documentation

## 2020-06-03 NOTE — H&P (Signed)
ORTHOPAEDIC HISTORY & PHYSICAL Office Visit Michelene Gardener, [place] - 05/29/2020 3:45 PM EDT Formatting of this note is different from the original. [facility] Chief Complaint:   Chief Complaint  Patient presents with  . Knee Pain  H & P RIGHT KNEE   History of Present Illness:   Connie Moreno is a 59 y.o. female that presents to clinic today for her preoperative history and evaluation. Patient presents unaccompanied. The patient is scheduled to undergo a right total knee arthroplasty on 06/04/20 by Dr. Ernest Pine. Her pain began several years ago. The pain is located along the lateral and medial aspects of the right knee. She describes her pain as worse with weightbearing. She reports associated swelling and giving way of the knee. She denies associated numbness or tingling, denies locking.   The patient's symptoms have progressed to the point that they decrease her quality of life. The patient has previously undergone conservative treatment including NSAIDS and injections to the knee without adequate control of her symptoms.  Patient denies history of blood clots, lumbar back surgery, or significant cardiac history. Patient does admit to significant, near daily consumption of alcohol Past Medical, Surgical, Family, Social History, Allergies, Medications:   Past Medical History:  Past Medical History:  Diagnosis Date  . Asthma, unspecified asthma severity, unspecified whether complicated, unspecified whether persistent  . Cardiac murmur  . GERD (gastroesophageal reflux disease)   Past Surgical History:  Past Surgical History:  Procedure Laterality Date  . FRACTURE SURGERY  . KNEE ARTHROSCOPY   Current Medications:  Current Outpatient Medications  Medication Sig Dispense Refill  . albuterol 90 mcg/actuation inhaler Inhale 2 inhalations into the lungs every 6 (six) hours as needed  . budesonide-formoteroL (SYMBICORT) 80-4.5  mcg/actuation inhaler Inhale 2 inhalations into the lungs 2 (two) times daily  . cholecalciferol (VITAMIN D3) 2,000 unit capsule Take 2,000 Units by mouth once daily  . meloxicam (MOBIC) 15 MG tablet Take 15 mg by mouth once daily  . montelukast (SINGULAIR) 10 mg tablet Take 10 mg by mouth nightly  . omeprazole (PRILOSEC) 20 MG DR capsule Take 20 mg by mouth once daily   No current facility-administered medications for this visit.   Allergies: No Known Allergies  Social History:  Social History   Socioeconomic History  . Marital status: Single  Spouse name: Not on file  . Number of children: 3  . Years of education: 39  . Highest education level: Not on file  Occupational History  . Occupation: Disabled-due to back issues  Tobacco Use  . Smoking status: Current Every Day Smoker  Packs/day: 0.50  Years: 44.00  Pack years: 22.00  Types: Cigarettes  . Smokeless tobacco: Never Used  . Tobacco comment: Trying to cut back  Substance and Sexual Activity  . Alcohol use: Yes  Alcohol/week: 8.0 standard drinks  Types: 8 Cans of beer per week  . Drug use: Yes  Comment: marijuana 3-4x week  . Sexual activity: Defer  Partners: Male  Other Topics Concern  . Not on file  Social History Narrative  . Not on file   Social Determinants of Health   Financial Resource Strain:  . Difficulty of Paying Living Expenses:  Food Insecurity:  . Worried About Programme researcher, broadcasting/film/video in the Last Year:  . Barista in the Last Year:  Transportation Needs:  . Freight forwarder (Medical):  Marland Kitchen Lack of Transportation (Non-Medical):  Physical Activity:  . Days  of Exercise per Week:  . Minutes of Exercise per Session:  Stress:  . Feeling of Stress :  Social Connections:  . Frequency of Communication with Friends and Family:  . Frequency of Social Gatherings with Friends and Family:  . Attends Religious Services:  . Active Member of Clubs or Organizations:  . Attends Banker  Meetings:  Marland Kitchen Marital Status:   Family History:  Family History  Problem Relation Age of Onset  . High blood pressure (Hypertension) Mother  . Heart disease Mother   Review of Systems:   A 10+ ROS was performed, reviewed, and the pertinent orthopaedic findings are documented in the HPI.   Physical Examination:   BP 120/70  Ht 170.2 cm (5\' 7" )  Wt 64 kg (141 lb)  BMI 22.08 kg/m   Patient is a well-developed, well-nourished female in no acute distress. Patient has normal mood and affect. Patient is alert and oriented to person, place, and time.   HEENT: Atraumatic, normocephalic. Pupils equal and reactive to light. Extraocular motion intact. Noninjected sclera.  Cardiovascular: Regular rate and rhythm, with no murmurs, rubs, or gallops. Distal pulses palpable.  Respiratory: Lungs clear to auscultation bilaterally.   Right Knee: Soft tissue swelling: mild Effusion: none Erythema: none Crepitance: mild Tenderness: Lateral and medial joint line tenderness Alignment: relative valgus Mediolateral laxity: lateral pseudolaxity Posterior sag: negative Patellar tracking: Good tracking without evidence of subluxation or tilt Atrophy: No significant atrophy.  Quadriceps tone was fair to good. Range of motion: 0/0/100 degrees   Sensation intact over the saphenous, lateral sural cutaneous, superficial fibular, and deep fibular nerve distributions.  Tests Performed/Reviewed:  X-rays  No new radiographs were obtained today. Previous radiographs were reviewed of the right knee and revealed complete loss of lateral compartment joint space with bone-on-bone contact, subchondral sclerosis, and osteophyte formation noted. Patellofemoral compartment appears well to well-preserved. Medial compartment relatively well-preserved with mild osteophyte formation.  Impression:   ICD-10-CM  1. Primary osteoarthritis of right knee M17.11   Plan:   The patient has end-stage degenerative  changes of the right knee. It was explained to the patient that the condition is progressive in nature. Having failed conservative treatment, the patient has elected to proceed with a total joint arthroplasty. The patient will undergo a total joint arthroplasty with Dr. 07-16-2002. The risks of surgery, including blood clot and infection, were discussed with the patient. Measures to reduce these risks, including the use of anticoagulation, perioperative antibiotics, and early ambulation were discussed. The importance of postoperative physical therapy was discussed with the patient. The patient elects to proceed with surgery. The patient is instructed to stop all blood thinners prior to surgery. The patient is instructed to call the hospital the day before surgery to learn of the proper arrival time.   Contact our office with any questions or concerns. Follow up as indicated, or sooner should any new problems arise, if conditions worsen, or if they are otherwise concerned.   Ernest Pine, PA-C [facility] [address] [place], [place] Phone: [phone]  This note was generated in part with voice recognition software and I apologize for any typographical errors that were not detected and corrected.   Electronically signed by [phone], PA at 06/01/2020 5:46 PM EDT

## 2020-06-04 ENCOUNTER — Ambulatory Visit
Admission: RE | Admit: 2020-06-04 | Discharge: 2020-06-04 | Disposition: A | Discharge status: Home / Self Care | Payer: Medicaid Other | Attending: Orthopedic Surgery | Admitting: Orthopedic Surgery

## 2020-06-04 ENCOUNTER — Encounter: Payer: Self-pay | Admitting: Orthopedic Surgery

## 2020-06-04 ENCOUNTER — Encounter
Admission: RE | Disposition: A | Discharge status: Home / Self Care | Payer: Self-pay | Source: Home / Self Care | Attending: Orthopedic Surgery

## 2020-06-04 DIAGNOSIS — Z79899 Other long term (current) drug therapy: Secondary | ICD-10-CM | POA: Diagnosis not present

## 2020-06-04 DIAGNOSIS — Z539 Procedure and treatment not carried out, unspecified reason: Secondary | ICD-10-CM | POA: Diagnosis not present

## 2020-06-04 DIAGNOSIS — Z8249 Family history of ischemic heart disease and other diseases of the circulatory system: Secondary | ICD-10-CM | POA: Insufficient documentation

## 2020-06-04 DIAGNOSIS — J45909 Unspecified asthma, uncomplicated: Secondary | ICD-10-CM | POA: Insufficient documentation

## 2020-06-04 DIAGNOSIS — F1490 Cocaine use, unspecified, uncomplicated: Secondary | ICD-10-CM | POA: Insufficient documentation

## 2020-06-04 DIAGNOSIS — Z7951 Long term (current) use of inhaled steroids: Secondary | ICD-10-CM | POA: Insufficient documentation

## 2020-06-04 DIAGNOSIS — M1711 Unilateral primary osteoarthritis, right knee: Secondary | ICD-10-CM | POA: Diagnosis not present

## 2020-06-04 DIAGNOSIS — K219 Gastro-esophageal reflux disease without esophagitis: Secondary | ICD-10-CM | POA: Diagnosis not present

## 2020-06-04 DIAGNOSIS — F17210 Nicotine dependence, cigarettes, uncomplicated: Secondary | ICD-10-CM | POA: Insufficient documentation

## 2020-06-04 LAB — URINE DRUG SCREEN, QUALITATIVE (ARMC ONLY)
Amphetamines, Ur Screen: NOT DETECTED
Barbiturates, Ur Screen: NOT DETECTED
Benzodiazepine, Ur Scrn: NOT DETECTED
Cannabinoid 50 Ng, Ur ~~LOC~~: POSITIVE — AB
Cocaine Metabolite,Ur ~~LOC~~: POSITIVE — AB
MDMA (Ecstasy)Ur Screen: NOT DETECTED
Methadone Scn, Ur: NOT DETECTED
Opiate, Ur Screen: NOT DETECTED
Phencyclidine (PCP) Ur S: NOT DETECTED
Tricyclic, Ur Screen: NOT DETECTED

## 2020-06-04 LAB — ABO/RH: ABO/RH(D): B POS

## 2020-06-04 SURGERY — ARTHROPLASTY, KNEE, TOTAL, USING IMAGELESS COMPUTER-ASSISTED NAVIGATION
Anesthesia: Choice

## 2020-06-04 MED ORDER — CELECOXIB 200 MG PO CAPS: 400.0000 mg | ORAL_CAPSULE | Freq: Once | ORAL | Status: AC

## 2020-06-04 MED ORDER — CEFAZOLIN SODIUM-DEXTROSE 2-4 GM/100ML-% IV SOLN: 2.0000 g | INTRAVENOUS | Status: DC

## 2020-06-04 MED ORDER — ORAL CARE MOUTH RINSE: 15.0000 mL | Freq: Once | OROMUCOSAL | Status: AC

## 2020-06-04 MED ORDER — ENSURE PRE-SURGERY PO LIQD
296.0000 mL | Freq: Once | ORAL | Status: AC
  Administered 2020-06-04: 296 mL via ORAL
  Filled 2020-06-04: qty 296

## 2020-06-04 MED ORDER — TRANEXAMIC ACID-NACL 1000-0.7 MG/100ML-% IV SOLN: 1000.0000 mg | INTRAVENOUS | Status: DC

## 2020-06-04 MED ORDER — CHLORHEXIDINE GLUCONATE 0.12 % MT SOLN
OROMUCOSAL | Status: AC
  Administered 2020-06-04: 15 mL via OROMUCOSAL
  Filled 2020-06-04: qty 15

## 2020-06-04 MED ORDER — CEFAZOLIN SODIUM-DEXTROSE 2-4 GM/100ML-% IV SOLN
INTRAVENOUS | Status: AC
  Filled 2020-06-04: qty 100

## 2020-06-04 MED ORDER — LACTATED RINGERS IV SOLN: INTRAVENOUS | Status: DC

## 2020-06-04 MED ORDER — GABAPENTIN 300 MG PO CAPS: 300.0000 mg | ORAL_CAPSULE | Freq: Once | ORAL | Status: AC

## 2020-06-04 MED ORDER — DEXAMETHASONE SODIUM PHOSPHATE 10 MG/ML IJ SOLN: 8.0000 mg | Freq: Once | INTRAMUSCULAR | Status: DC

## 2020-06-04 MED ORDER — CELECOXIB 200 MG PO CAPS
ORAL_CAPSULE | ORAL | Status: AC
  Administered 2020-06-04: 400 mg via ORAL
  Filled 2020-06-04: qty 2

## 2020-06-04 MED ORDER — CHLORHEXIDINE GLUCONATE 0.12 % MT SOLN: 15.0000 mL | Freq: Once | OROMUCOSAL | Status: AC

## 2020-06-04 MED ORDER — CHLORHEXIDINE GLUCONATE 4 % EX LIQD
60.0000 mL | Freq: Once | CUTANEOUS | Status: AC
  Administered 2020-06-04: 4 via TOPICAL

## 2020-06-04 MED ORDER — TRANEXAMIC ACID-NACL 1000-0.7 MG/100ML-% IV SOLN
INTRAVENOUS | Status: AC
  Filled 2020-06-04: qty 100

## 2020-06-04 MED ORDER — GABAPENTIN 300 MG PO CAPS
ORAL_CAPSULE | ORAL | Status: AC
  Administered 2020-06-04: 300 mg via ORAL
  Filled 2020-06-04: qty 1

## 2020-06-04 MED ORDER — DEXAMETHASONE SODIUM PHOSPHATE 10 MG/ML IJ SOLN
INTRAMUSCULAR | Status: AC
  Filled 2020-06-04: qty 1

## 2020-06-04 SURGICAL SUPPLY — 66 items
BATTERY INSTRU NAVIGATION (MISCELLANEOUS) ×16 IMPLANT
BLADE SAW 70X12.5 (BLADE) ×4 IMPLANT
BLADE SAW 90X13X1.19 OSCILLAT (BLADE) ×4 IMPLANT
BLADE SAW 90X25X1.19 OSCILLAT (BLADE) ×4 IMPLANT
CANISTER SUCT 3000ML PPV (MISCELLANEOUS) ×4 IMPLANT
COOLER POLAR GLACIER W/PUMP (MISCELLANEOUS) ×4 IMPLANT
COVER WAND RF STERILE (DRAPES) ×4 IMPLANT
CUFF TOURN SGL QUICK 24 (TOURNIQUET CUFF)
CUFF TOURN SGL QUICK 30 (TOURNIQUET CUFF)
CUFF TRNQT CYL 24X4X16.5-23 (TOURNIQUET CUFF) IMPLANT
CUFF TRNQT CYL 30X4X21-28X (TOURNIQUET CUFF) IMPLANT
DRAPE 3/4 80X56 (DRAPES) ×4 IMPLANT
DRSG DERMACEA 8X12 NADH (GAUZE/BANDAGES/DRESSINGS) ×4 IMPLANT
DRSG OPSITE POSTOP 4X14 (GAUZE/BANDAGES/DRESSINGS) ×4 IMPLANT
DRSG TEGADERM 4X4.75 (GAUZE/BANDAGES/DRESSINGS) ×4 IMPLANT
DURAPREP 26ML APPLICATOR (WOUND CARE) ×8 IMPLANT
ELECT REM PT RETURN 9FT ADLT (ELECTROSURGICAL) ×3
ELECTRODE REM PT RTRN 9FT ADLT (ELECTROSURGICAL) ×2 IMPLANT
EX-PIN ORTHOLOCK NAV 4X150 (PIN) ×8 IMPLANT
GLOVE BIO SURGEON STRL SZ7.5 (GLOVE) ×8 IMPLANT
GLOVE BIOGEL M STRL SZ7.5 (GLOVE) ×8 IMPLANT
GLOVE BIOGEL PI IND STRL 7.5 (GLOVE) ×2 IMPLANT
GLOVE BIOGEL PI INDICATOR 7.5 (GLOVE) ×2
GLOVE INDICATOR 8.0 STRL GRN (GLOVE) ×4 IMPLANT
GOWN STRL REUS W/ TWL LRG LVL3 (GOWN DISPOSABLE) ×4 IMPLANT
GOWN STRL REUS W/ TWL XL LVL3 (GOWN DISPOSABLE) ×2 IMPLANT
GOWN STRL REUS W/TWL LRG LVL3 (GOWN DISPOSABLE) ×4
GOWN STRL REUS W/TWL XL LVL3 (GOWN DISPOSABLE) ×2
HEMOVAC 400CC 10FR (MISCELLANEOUS) ×4 IMPLANT
HOLDER FOLEY CATH W/STRAP (MISCELLANEOUS) ×4 IMPLANT
HOOD PEEL AWAY FLYTE STAYCOOL (MISCELLANEOUS) ×8 IMPLANT
KIT TURNOVER KIT A (KITS) ×4 IMPLANT
KNIFE SCULPS 14X20 (INSTRUMENTS) ×4 IMPLANT
LABEL OR SOLS (LABEL) ×4 IMPLANT
MANIFOLD NEPTUNE II (INSTRUMENTS) ×4 IMPLANT
NDL SAFETY ECLIPSE 18X1.5 (NEEDLE) ×2 IMPLANT
NDL SPNL 20GX3.5 QUINCKE YW (NEEDLE) ×2 IMPLANT
NEEDLE HYPO 18GX1.5 SHARP (NEEDLE) ×2
NEEDLE SPNL 20GX3.5 QUINCKE YW (NEEDLE) ×6 IMPLANT
NS IRRIG 500ML POUR BTL (IV SOLUTION) ×4 IMPLANT
PACK TOTAL KNEE (MISCELLANEOUS) ×4 IMPLANT
PAD WRAPON POLAR KNEE (MISCELLANEOUS) ×2 IMPLANT
PENCIL SMOKE EVACUATOR COATED (MISCELLANEOUS) ×4 IMPLANT
PENCIL SMOKE ULTRAEVAC 22 CON (MISCELLANEOUS) ×4 IMPLANT
PIN FIXATION 1/8DIA X 3INL (PIN) ×12 IMPLANT
PULSAVAC PLUS IRRIG FAN TIP (DISPOSABLE) ×3
SOL .9 NS 3000ML IRR  AL (IV SOLUTION) ×2
SOL .9 NS 3000ML IRR UROMATIC (IV SOLUTION) ×2 IMPLANT
SOL PREP PVP 2OZ (MISCELLANEOUS) ×3
SOLUTION PREP PVP 2OZ (MISCELLANEOUS) ×2 IMPLANT
SPONGE DRAIN TRACH 4X4 STRL 2S (GAUZE/BANDAGES/DRESSINGS) ×4 IMPLANT
STAPLER SKIN PROX 35W (STAPLE) ×4 IMPLANT
STOCKINETTE IMPERV 14X48 (MISCELLANEOUS) IMPLANT
STRAP TIBIA SHORT (MISCELLANEOUS) ×4 IMPLANT
SUCTION FRAZIER HANDLE 10FR (MISCELLANEOUS) ×2
SUCTION TUBE FRAZIER 10FR DISP (MISCELLANEOUS) ×2 IMPLANT
SUT VIC AB 0 CT1 36 (SUTURE) ×8 IMPLANT
SUT VIC AB 1 CT1 36 (SUTURE) ×8 IMPLANT
SUT VIC AB 2-0 CT2 27 (SUTURE) ×4 IMPLANT
SYR 20ML LL LF (SYRINGE) ×4 IMPLANT
SYR 30ML LL (SYRINGE) ×8 IMPLANT
TIP FAN IRRIG PULSAVAC PLUS (DISPOSABLE) ×2 IMPLANT
TOWEL OR 17X26 4PK STRL BLUE (TOWEL DISPOSABLE) ×4 IMPLANT
TOWER CARTRIDGE SMART MIX (DISPOSABLE) ×4 IMPLANT
TRAY FOLEY MTR SLVR 16FR STAT (SET/KITS/TRAYS/PACK) ×4 IMPLANT
WRAPON POLAR PAD KNEE (MISCELLANEOUS) ×3

## 2020-06-04 NOTE — H&P (Signed)
SURGERY CANCELLED DUE TO DRUG SCREEN POSITIVE FOR COCAINE. (ALSO POSITIVE FOR CANNABINOID)  Connie Moreno P. Angie Fava M.D.

## 2020-06-04 NOTE — Progress Notes (Signed)
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 Will not proceed with case. Spoke to Dr Ernest Pine who agreed.  I spoke with the patient regarding the dangers associated with anesthesia and cocaine in system, and empathized with her situation, and recommended to abstain from drugs in future, but at the very least 5 days prior to an anesthetic. Patient was tearful and saying she cannot tolerate her joint pain for another few months while waiting to be rescheduled.

## 2020-06-18 ENCOUNTER — Other Ambulatory Visit: Payer: Self-pay | Admitting: Nurse Practitioner

## 2020-06-18 DIAGNOSIS — J4540 Moderate persistent asthma, uncomplicated: Secondary | ICD-10-CM

## 2020-06-18 DIAGNOSIS — J302 Other seasonal allergic rhinitis: Secondary | ICD-10-CM

## 2020-06-18 NOTE — Telephone Encounter (Signed)
Medication Refill - Medication: omeprazole (PRILOSEC) 20 MG capsule  montelukast (SINGULAIR) 10 MG tablet  albuterol (VENTOLIN HFA) 108 (90 Base) MCG/ACT inhaler   Has the patient contacted their pharmacy? No. (Agent: If no, request that the patient contact the pharmacy for the refill.) (Agent: If yes, when and what did the pharmacy advise?)  Preferred Pharmacy (with phone number or street name): [facility] [address]), [place] - [address]  [address]) [place] [address]  Phone:  [phone] Fax:  [phone]  Agent: Please be advised that RX refills may take up to 3 business days. We ask that you follow-up with your pharmacy.

## 2020-06-18 NOTE — Telephone Encounter (Signed)
Requested medication (s) are due for refill today: no  Requested medication (s) are on the active medication list: yes   Last refill:  05/13/2019  Future visit scheduled: no  Notes to clinic:  patient overdue for office visit  Medication have expired    Requested Prescriptions  Pending Prescriptions Disp Refills   montelukast (SINGULAIR) 10 MG tablet 30 tablet 3    Sig: Take 1 tablet (10 mg total) by mouth at bedtime.      Pulmonology:  Leukotriene Inhibitors Failed - 06/18/2020 10:03 AM      Failed - Valid encounter within last 12 months    Recent Outpatient Visits           1 year ago Chronic midline low back pain with right-sided sciatica   [facility] Galen Manila, NP   1 year ago Gastroesophageal reflux disease, esophagitis presence not specified   [facility] Galen Manila, NP   1 year ago Exposure to [employer] Virus   [facility], Netta Neat, DO   2 years ago Seasonal allergies   [facility] Galen Manila, NP   3 years ago Acute midline low back pain with left-sided sciatica   [facility] Galen Manila, NP                albuterol (VENTOLIN HFA) 108 (90 Base) MCG/ACT inhaler 18 g 3    Sig: Inhale 2 puffs into the lungs every 6 (six) hours as needed for wheezing or shortness of breath.      Pulmonology:  Beta Agonists Failed - 06/18/2020 10:03 AM      Failed - One inhaler should last at least one month. If the patient is requesting refills earlier, contact the patient to check for uncontrolled symptoms.      Failed - Valid encounter within last 12 months    Recent Outpatient Visits           1 year ago Chronic midline low back pain with right-sided sciatica   [facility] Galen Manila, NP   1 year ago Gastroesophageal reflux disease, esophagitis presence not specified   [facility] Galen Manila, NP   1 year ago Exposure to [employer] Virus   [facility], Netta Neat, DO   2 years ago Seasonal allergies   [facility] Galen Manila, NP   3 years ago Acute midline low back pain with left-sided sciatica   [facility] Galen Manila, NP

## 2020-06-19 ENCOUNTER — Other Ambulatory Visit: Payer: Self-pay | Admitting: Nurse Practitioner

## 2020-06-19 DIAGNOSIS — J4540 Moderate persistent asthma, uncomplicated: Secondary | ICD-10-CM

## 2020-06-19 DIAGNOSIS — J302 Other seasonal allergic rhinitis: Secondary | ICD-10-CM

## 2020-06-19 DIAGNOSIS — K219 Gastro-esophageal reflux disease without esophagitis: Secondary | ICD-10-CM

## 2020-08-22 NOTE — Discharge Instructions (Signed)
Instructions after Total Knee Replacement   Connie Moreno, Jr., M.D.     Dept. of Orthopaedics & Sports Medicine  [facility]  [address]  [place], [place]  [address]  Phone: [phone]   Fax: [phone]    DIET: Drink plenty of non-alcoholic fluids. Resume your normal diet. Include foods high in fiber.  ACTIVITY:  You may use crutches or a walker with weight-bearing as tolerated, unless instructed otherwise. You may be weaned off of the walker or crutches by your Physical Therapist.  Do NOT place pillows under the knee. Anything placed under the knee could limit your ability to straighten the knee.   Continue doing gentle exercises. Exercising will reduce the pain and swelling, increase motion, and prevent muscle weakness.   Please continue to use the TED compression stockings for 6 weeks. You may remove the stockings at night, but should reapply them in the morning. Do not drive or operate any equipment until instructed.  WOUND CARE:  Continue to use the PolarCare or ice packs periodically to reduce pain and swelling. You may bathe or shower after the staples are removed at the first office visit following surgery.  MEDICATIONS: You may resume your regular medications. Please take the pain medication as prescribed on the medication. Do not take pain medication on an empty stomach. You have been given a prescription for a blood thinner (Lovenox or Coumadin). Please take the medication as instructed. (NOTE: After completing a 2 week course of Lovenox, take one Enteric-coated aspirin once a day. This along with elevation will help reduce the possibility of phlebitis in your operated leg.) Do not drive or drink alcoholic beverages when taking pain medications.  CALL THE OFFICE FOR: Temperature above 101 degrees Excessive bleeding or drainage on the dressing. Excessive swelling, coldness, or paleness of the toes. Persistent nausea and vomiting.  FOLLOW-UP:  You  should have an appointment to return to the office in 10-14 days after surgery. Arrangements have been made for continuation of Physical Therapy (either home therapy or outpatient therapy).   [facility] Department Directory         [email or web address]       [email or web address]          Cardiology  Appointments: [place] - [phone] [place] - [phone]  Endocrinology  Appointments: [place] - [phone] [place] - [phone]  Gastroenterology  Appointments: [place] - [phone] [place] - [phone]        General Surgery   Appointments: [place] - [phone]  Internal Medicine/Family Medicine  Appointments: [place] - [phone] [place] - [phone] [place] - [phone]  Metabolic and Weigh Loss Surgery  Appointments: [place] - [phone]        Neurology  Appointments: [place] - [phone] [place] - [phone]  Neurosurgery  Appointments: [place] - [phone]  Obstetrics & Gynecology  Appointments: [place] - [phone] [place] - [phone]        Pediatrics  Appointments: [place] - [phone] [place] - [phone]  Physiatry  Appointments: [place] -[phone]  Physical Therapy  Appointments: [place] - [phone] [place] - [phone]        Podiatry  Appointments: [place] - [phone] [place] - [phone]  Pulmonology  Appointments: [place] - [phone]  Rheumatology  Appointments: [place] - [phone]        [place] Location: [facility]  [address] , [place]  [address]  [place] Location: [facility] [address] [place], [place]  [address]  [place] Location: [facility] [address] [place], [place]  [address]    

## 2020-08-28 ENCOUNTER — Encounter
Admission: RE | Admit: 2020-08-28 | Discharge: 2020-08-28 | Disposition: A | Discharge status: Home / Self Care | Payer: Medicaid Other | Source: Ambulatory Visit | Attending: Orthopedic Surgery | Admitting: Orthopedic Surgery

## 2020-08-28 ENCOUNTER — Other Ambulatory Visit: Payer: Self-pay

## 2020-08-28 DIAGNOSIS — Z01812 Encounter for preprocedural laboratory examination: Secondary | ICD-10-CM | POA: Insufficient documentation

## 2020-08-28 LAB — COMPREHENSIVE METABOLIC PANEL
ALT: 42 U/L (ref 0–44)
AST: 40 U/L (ref 15–41)
Albumin: 4.4 g/dL (ref 3.5–5.0)
Alkaline Phosphatase: 103 U/L (ref 38–126)
Anion gap: 9 (ref 5–15)
BUN: 8 mg/dL (ref 6–20)
CO2: 26 mmol/L (ref 22–32)
Calcium: 9.4 mg/dL (ref 8.9–10.3)
Chloride: 103 mmol/L (ref 98–111)
Creatinine, Ser: 0.67 mg/dL (ref 0.44–1.00)
GFR, Estimated: >60 mL/min (ref >60)
Glucose, Bld: 103 mg/dL — ABNORMAL HIGH (ref 70–99)
Potassium: 3.7 mmol/L (ref 3.5–5.1)
Sodium: 138 mmol/L (ref 135–145)
Total Bilirubin: 0.7 mg/dL (ref 0.3–1.2)
Total Protein: 7.9 g/dL (ref 6.5–8.1)

## 2020-08-28 LAB — CBC
HCT: 42 % (ref 36.0–46.0)
Hemoglobin: 14.1 g/dL (ref 12.0–15.0)
MCH: 32.3 pg (ref 26.0–34.0)
MCHC: 33.6 g/dL (ref 30.0–36.0)
MCV: 96.1 fL (ref 80.0–100.0)
Platelets: 136 10*3/uL — ABNORMAL LOW (ref 150–400)
RBC: 4.37 MIL/uL (ref 3.87–5.11)
RDW: 12.4 % (ref 11.5–15.5)
WBC: 2.9 10*3/uL — ABNORMAL LOW (ref 4.0–10.5)
nRBC: 0 % (ref 0.0–0.2)

## 2020-08-28 LAB — SURGICAL PCR SCREEN
MRSA, PCR: NEGATIVE
Staphylococcus aureus: NEGATIVE

## 2020-08-28 LAB — PROTIME-INR
INR: 1 (ref 0.8–1.2)
Prothrombin Time: 12.4 seconds (ref 11.4–15.2)

## 2020-08-28 LAB — URINALYSIS, ROUTINE W REFLEX MICROSCOPIC
Bilirubin Urine: NEGATIVE
Glucose, UA: NEGATIVE mg/dL
Hgb urine dipstick: NEGATIVE
Ketones, ur: NEGATIVE mg/dL
Leukocytes,Ua: NEGATIVE
Nitrite: NEGATIVE
Protein, ur: NEGATIVE mg/dL
Specific Gravity, Urine: 1.004 — ABNORMAL LOW (ref 1.005–1.030)
pH: 6 (ref 5.0–8.0)

## 2020-08-28 LAB — APTT: aPTT: 31 seconds (ref 24–36)

## 2020-08-28 LAB — SEDIMENTATION RATE: Sed Rate: 13 mm/hr (ref 0–30)

## 2020-08-28 LAB — C-REACTIVE PROTEIN: CRP: 0.5 mg/dL (ref <1.0)

## 2020-08-28 NOTE — Patient Instructions (Addendum)
Your procedure is scheduled on: Wed. 11/17 Report to Registration desk in the medical mall. Then to Day surgery on the 2nd floor To find out your arrival time please call [phone] between 1PM - 3PM on Tues 11/16  Remember: Instructions that are not followed completely may result in serious medical risk,  up to and including death, or upon the discretion of your surgeon and anesthesiologist your  surgery may need to be rescheduled.     _X__ 1. Do not eat food after midnight the night before your procedure.                 No chewing gum or hard candies. You may drink clear liquids up to 2 hours                 before you are scheduled to arrive for your surgery- DO not drink clear                 liquids within 2 hours of the start of your surgery.                 Clear Liquids include:  water, apple juice without pulp, clear Gatorade, G2 or                  Gatorade Zero (avoid Red/Purple/Blue), Black Coffee or Tea (Do not add                 anything to coffee or tea). ___x__2.   Complete the "Ensure Clear Pre-surgery Clear Carbohydrate Drink" provided to you, 2 hours before arrival. **If you       are diabetic you will be provided with an alternative drink, Gatorade Zero or G2.  __X__2.  On the morning of surgery brush your teeth with toothpaste and water, you                may rinse your mouth with mouthwash if you wish.  Do not swallow any toothpaste of mouthwash.     _X__ 3.  No Alcohol for 24 hours before or after surgery.   _X__ 4.  Do Not Smoke or use e-cigarettes For 24 Hours Prior to Your Surgery.                 Do not use any chewable tobacco products for at least 6 hours prior to                 Surgery.  _X__  5.  Do not use any recreational drugs (marijuana, cocaine, heroin, ecstasy, MDMA or other)                For at least one week prior to your surgery.  Combination of these drugs with anesthesia                May have life threatening  results.  ____  6.  Bring all medications with you on the day of surgery if instructed.   _x___  7.  Notify your doctor if there is any change in your medical condition      (cold, fever, infections).     Do not wear jewelry, make-up, hairpins, clips or nail polish. Do not wear lotions, powders, or perfumes. You may wear deodorant. Do not shave 48 hours prior to surgery. Men may shave face and neck. Do not bring valuables to the hospital.    [facility] is not responsible for any belongings or valuables.  Contacts, dentures or bridgework may not be worn into surgery. Leave your suitcase in the car. After surgery it may be brought to your room. For patients admitted to the hospital, discharge time is determined by your treatment team.   Patients discharged the day of surgery will not be allowed to drive home.   Make arrangements for someone to be with you for the first 24 hours of your Same Day Discharge.    Please read over the following fact sheets that you were given:   Incentive spirometer    __x__ Take these medicines the morning of surgery with A SIP OF WATER:    1. none  2.   3.   4.  5.  6.  ____ Fleet Enema (as directed)   __x__ Use CHG Soap (or wipes) as directed  ____ Use Benzoyl Peroxide Gel as instructed  __x__ Use inhalers on the day of surgery budesonide-formoterol (SYMBICORT) 80-4.5 MCG/ACT inhaler,albuterol (VENTOLIN HFA) 108 (90 Base) MCG/ACT inhaler and bring with you  ____ Stop metformin 2 days prior to surgery    ____ Take 1/2 of usual insulin dose the night before surgery. No insulin the morning          of surgery.   ____ Stop Coumadin/Plavix/aspirin on   _x___ Stop Anti-inflammatoriesmeloxicam (MOBIC) 15 MG tablet,naproxen (NAPROSYN) 500 MG tablet, ibuprofen and aspirin products tomorrow    ____ Stop supplements until after surgery.    ____ Bring C-Pap to the hospital.    If you have any questions regarding your pre-procedure  instructions,  Please call Pre-admit Testing at [phone] [facility]

## 2020-08-29 LAB — URINE CULTURE
Culture: NO GROWTH
Special Requests: NORMAL

## 2020-09-03 ENCOUNTER — Other Ambulatory Visit
Admission: RE | Admit: 2020-09-03 | Discharge: 2020-09-03 | Disposition: A | Discharge status: Home / Self Care | Payer: Medicaid Other | Source: Ambulatory Visit | Attending: Orthopedic Surgery | Admitting: Orthopedic Surgery

## 2020-09-03 ENCOUNTER — Other Ambulatory Visit: Payer: Self-pay

## 2020-09-03 DIAGNOSIS — Z01812 Encounter for preprocedural laboratory examination: Secondary | ICD-10-CM | POA: Insufficient documentation

## 2020-09-03 DIAGNOSIS — Z20822 Contact with and (suspected) exposure to covid-19: Secondary | ICD-10-CM | POA: Insufficient documentation

## 2020-09-04 LAB — SARS CORONAVIRUS 2 (TAT 6-24 HRS): SARS Coronavirus 2: NEGATIVE

## 2020-09-04 MED ORDER — CHLORHEXIDINE GLUCONATE 0.12 % MT SOLN: 15.0000 mL | Freq: Once | OROMUCOSAL | Status: DC

## 2020-09-04 MED ORDER — ORAL CARE MOUTH RINSE: 15.0000 mL | Freq: Once | OROMUCOSAL | Status: DC

## 2020-09-04 MED ORDER — FAMOTIDINE 20 MG PO TABS: 20.0000 mg | ORAL_TABLET | Freq: Once | ORAL | Status: DC

## 2020-09-04 MED ORDER — LACTATED RINGERS IV SOLN: INTRAVENOUS | Status: DC

## 2020-09-04 MED ORDER — DEXAMETHASONE SODIUM PHOSPHATE 10 MG/ML IJ SOLN: 8.0000 mg | Freq: Once | INTRAMUSCULAR | Status: DC

## 2020-09-04 MED ORDER — TRANEXAMIC ACID-NACL 1000-0.7 MG/100ML-% IV SOLN: 1000.0000 mg | INTRAVENOUS | Status: DC

## 2020-09-04 MED ORDER — CEFAZOLIN SODIUM-DEXTROSE 2-4 GM/100ML-% IV SOLN: 2.0000 g | INTRAVENOUS | Status: DC

## 2020-09-04 MED ORDER — GABAPENTIN 300 MG PO CAPS: 300.0000 mg | ORAL_CAPSULE | Freq: Once | ORAL | Status: DC

## 2020-09-04 MED ORDER — CELECOXIB 200 MG PO CAPS: 400.0000 mg | ORAL_CAPSULE | Freq: Once | ORAL | Status: DC

## 2020-09-04 MED ORDER — CHLORHEXIDINE GLUCONATE 4 % EX LIQD: 60.0000 mL | Freq: Once | CUTANEOUS | Status: DC

## 2020-09-05 ENCOUNTER — Inpatient Hospital Stay: Payer: Medicaid Other | Admitting: Anesthesiology

## 2020-09-05 ENCOUNTER — Other Ambulatory Visit: Payer: Self-pay

## 2020-09-05 ENCOUNTER — Inpatient Hospital Stay
Admission: RE | Admit: 2020-09-05 | Discharge: 2020-09-06 | DRG: 470 | Disposition: A | Discharge status: Home Health | Payer: Medicaid Other | Attending: Orthopedic Surgery | Admitting: Orthopedic Surgery

## 2020-09-05 ENCOUNTER — Encounter
Admission: RE | Disposition: A | Discharge status: Home Health | Payer: Self-pay | Source: Home / Self Care | Attending: Orthopedic Surgery

## 2020-09-05 ENCOUNTER — Inpatient Hospital Stay: Payer: Medicaid Other

## 2020-09-05 ENCOUNTER — Encounter: Payer: Self-pay | Admitting: Orthopedic Surgery

## 2020-09-05 DIAGNOSIS — R011 Cardiac murmur, unspecified: Secondary | ICD-10-CM | POA: Diagnosis present

## 2020-09-05 DIAGNOSIS — Z20822 Contact with and (suspected) exposure to covid-19: Secondary | ICD-10-CM | POA: Diagnosis present

## 2020-09-05 DIAGNOSIS — F17210 Nicotine dependence, cigarettes, uncomplicated: Secondary | ICD-10-CM | POA: Diagnosis present

## 2020-09-05 DIAGNOSIS — K219 Gastro-esophageal reflux disease without esophagitis: Secondary | ICD-10-CM | POA: Diagnosis present

## 2020-09-05 DIAGNOSIS — Z8249 Family history of ischemic heart disease and other diseases of the circulatory system: Secondary | ICD-10-CM

## 2020-09-05 DIAGNOSIS — J45909 Unspecified asthma, uncomplicated: Secondary | ICD-10-CM | POA: Diagnosis present

## 2020-09-05 DIAGNOSIS — M1711 Unilateral primary osteoarthritis, right knee: Principal | ICD-10-CM | POA: Diagnosis present

## 2020-09-05 DIAGNOSIS — Z96659 Presence of unspecified artificial knee joint: Secondary | ICD-10-CM

## 2020-09-05 HISTORY — PX: KNEE ARTHROPLASTY: SHX992

## 2020-09-05 LAB — URINE DRUG SCREEN, QUALITATIVE (ARMC ONLY)
Amphetamines, Ur Screen: NOT DETECTED
Barbiturates, Ur Screen: NOT DETECTED
Benzodiazepine, Ur Scrn: NOT DETECTED
Cannabinoid 50 Ng, Ur ~~LOC~~: POSITIVE — AB
Cocaine Metabolite,Ur ~~LOC~~: NOT DETECTED
MDMA (Ecstasy)Ur Screen: NOT DETECTED
Methadone Scn, Ur: NOT DETECTED
Opiate, Ur Screen: NOT DETECTED
Phencyclidine (PCP) Ur S: NOT DETECTED
Tricyclic, Ur Screen: NOT DETECTED

## 2020-09-05 SURGERY — ARTHROPLASTY, KNEE, TOTAL, USING IMAGELESS COMPUTER-ASSISTED NAVIGATION
Anesthesia: Spinal | Site: Knee | Laterality: Right

## 2020-09-05 MED ORDER — FAMOTIDINE 20 MG PO TABS
ORAL_TABLET | ORAL | Status: AC
  Administered 2020-09-05: 20 mg via ORAL
  Filled 2020-09-05: qty 1

## 2020-09-05 MED ORDER — DIPHENHYDRAMINE HCL 12.5 MG/5ML PO ELIX: 12.5000 mg | ORAL_SOLUTION | ORAL | Status: DC | PRN

## 2020-09-05 MED ORDER — CELECOXIB 200 MG PO CAPS
200.0000 mg | ORAL_CAPSULE | Freq: Two times a day (BID) | ORAL | Status: DC
  Administered 2020-09-05 – 2020-09-06 (×2): 200 mg via ORAL
  Filled 2020-09-05 (×2): qty 1

## 2020-09-05 MED ORDER — BUPIVACAINE HCL (PF) 0.5 % IJ SOLN
INTRAMUSCULAR | Status: DC | PRN
  Administered 2020-09-05: 3 mL via INTRATHECAL

## 2020-09-05 MED ORDER — OXYCODONE HCL 5 MG/5ML PO SOLN: 5.0000 mg | Freq: Once | ORAL | Status: DC | PRN

## 2020-09-05 MED ORDER — ONDANSETRON HCL 4 MG/2ML IJ SOLN
INTRAMUSCULAR | Status: DC | PRN
  Administered 2020-09-05: 4 mg via INTRAVENOUS

## 2020-09-05 MED ORDER — TRANEXAMIC ACID-NACL 1000-0.7 MG/100ML-% IV SOLN: 1000.0000 mg | Freq: Once | INTRAVENOUS | Status: AC

## 2020-09-05 MED ORDER — ACETAMINOPHEN 10 MG/ML IV SOLN
INTRAVENOUS | Status: DC | PRN
  Administered 2020-09-05: 1000 mg via INTRAVENOUS

## 2020-09-05 MED ORDER — TRANEXAMIC ACID-NACL 1000-0.7 MG/100ML-% IV SOLN
INTRAVENOUS | Status: AC
  Administered 2020-09-05: 1000 mg via INTRAVENOUS
  Filled 2020-09-05: qty 100

## 2020-09-05 MED ORDER — FERROUS SULFATE 325 (65 FE) MG PO TABS
325.0000 mg | ORAL_TABLET | Freq: Two times a day (BID) | ORAL | Status: DC
  Administered 2020-09-06 (×2): 325 mg via ORAL
  Filled 2020-09-05 (×2): qty 1

## 2020-09-05 MED ORDER — DEXAMETHASONE SODIUM PHOSPHATE 10 MG/ML IJ SOLN
INTRAMUSCULAR | Status: AC
  Administered 2020-09-05: 8 mg via INTRAVENOUS
  Filled 2020-09-05: qty 1

## 2020-09-05 MED ORDER — DEXMEDETOMIDINE (PRECEDEX) IN NS 20 MCG/5ML (4 MCG/ML) IV SYRINGE
PREFILLED_SYRINGE | INTRAVENOUS | Status: DC | PRN
  Administered 2020-09-05: 8 ug via INTRAVENOUS
  Administered 2020-09-05: 12 ug via INTRAVENOUS

## 2020-09-05 MED ORDER — NEOMYCIN-POLYMYXIN B GU 40-200000 IR SOLN
Status: AC
  Filled 2020-09-05: qty 40

## 2020-09-05 MED ORDER — CEFAZOLIN SODIUM-DEXTROSE 2-4 GM/100ML-% IV SOLN
INTRAVENOUS | Status: AC
  Filled 2020-09-05: qty 100

## 2020-09-05 MED ORDER — MAGNESIUM HYDROXIDE 400 MG/5ML PO SUSP
30.0000 mL | Freq: Every day | ORAL | Status: DC
  Administered 2020-09-06: 30 mL via ORAL
  Filled 2020-09-05 (×2): qty 30

## 2020-09-05 MED ORDER — MONTELUKAST SODIUM 10 MG PO TABS
10.0000 mg | ORAL_TABLET | Freq: Every day | ORAL | Status: DC
  Administered 2020-09-05: 10 mg via ORAL
  Filled 2020-09-05: qty 1

## 2020-09-05 MED ORDER — LACTATED RINGERS IV SOLN: INTRAVENOUS | Status: DC | PRN

## 2020-09-05 MED ORDER — PROPOFOL 500 MG/50ML IV EMUL
INTRAVENOUS | Status: AC
  Filled 2020-09-05: qty 50

## 2020-09-05 MED ORDER — GABAPENTIN 300 MG PO CAPS
300.0000 mg | ORAL_CAPSULE | Freq: Every day | ORAL | Status: DC
  Administered 2020-09-05: 300 mg via ORAL
  Filled 2020-09-05: qty 3

## 2020-09-05 MED ORDER — LORATADINE 10 MG PO TABS
10.0000 mg | ORAL_TABLET | Freq: Every day | ORAL | Status: DC
  Administered 2020-09-06: 10 mg via ORAL
  Filled 2020-09-05 (×2): qty 1

## 2020-09-05 MED ORDER — SENNOSIDES-DOCUSATE SODIUM 8.6-50 MG PO TABS
1.0000 | ORAL_TABLET | Freq: Two times a day (BID) | ORAL | Status: DC
  Administered 2020-09-05 – 2020-09-06 (×2): 1 via ORAL
  Filled 2020-09-05 (×2): qty 1

## 2020-09-05 MED ORDER — ALUM & MAG HYDROXIDE-SIMETH 200-200-20 MG/5ML PO SUSP: 30.0000 mL | ORAL | Status: DC | PRN

## 2020-09-05 MED ORDER — FENTANYL CITRATE (PF) 100 MCG/2ML IJ SOLN: 25.0000 ug | INTRAMUSCULAR | Status: DC | PRN

## 2020-09-05 MED ORDER — GLYCOPYRROLATE 0.2 MG/ML IJ SOLN
INTRAMUSCULAR | Status: DC | PRN
  Administered 2020-09-05: .2 mg via INTRAVENOUS

## 2020-09-05 MED ORDER — PHENOL 1.4 % MT LIQD
1.0000 | OROMUCOSAL | Status: DC | PRN
  Filled 2020-09-05: qty 177

## 2020-09-05 MED ORDER — GABAPENTIN 300 MG PO CAPS
ORAL_CAPSULE | ORAL | Status: AC
  Administered 2020-09-05: 300 mg via ORAL
  Filled 2020-09-05: qty 1

## 2020-09-05 MED ORDER — MIDAZOLAM HCL 2 MG/2ML IJ SOLN
INTRAMUSCULAR | Status: AC
  Filled 2020-09-05: qty 2

## 2020-09-05 MED ORDER — MOMETASONE FURO-FORMOTEROL FUM 100-5 MCG/ACT IN AERO
2.0000 | INHALATION_SPRAY | Freq: Two times a day (BID) | RESPIRATORY_TRACT | Status: DC
  Administered 2020-09-05 – 2020-09-06 (×2): 2 via RESPIRATORY_TRACT
  Filled 2020-09-05: qty 8.8

## 2020-09-05 MED ORDER — CELECOXIB 200 MG PO CAPS
ORAL_CAPSULE | ORAL | Status: AC
  Administered 2020-09-05: 400 mg via ORAL
  Filled 2020-09-05: qty 2

## 2020-09-05 MED ORDER — VITAMIN D3 25 MCG (1000 UNIT) PO TABS
2000.0000 [IU] | ORAL_TABLET | Freq: Every day | ORAL | Status: DC
  Administered 2020-09-06: 2000 [IU] via ORAL
  Filled 2020-09-05 (×4): qty 2

## 2020-09-05 MED ORDER — FLEET ENEMA 7-19 GM/118ML RE ENEM: 1.0000 | ENEMA | Freq: Once | RECTAL | Status: DC | PRN

## 2020-09-05 MED ORDER — CEFAZOLIN SODIUM-DEXTROSE 2-4 GM/100ML-% IV SOLN
2.0000 g | Freq: Four times a day (QID) | INTRAVENOUS | Status: AC
  Administered 2020-09-05 – 2020-09-06 (×2): 2 g via INTRAVENOUS
  Filled 2020-09-05 (×3): qty 100

## 2020-09-05 MED ORDER — SURGIPHOR WOUND IRRIGATION SYSTEM - OPTIME
TOPICAL | Status: DC | PRN
  Administered 2020-09-05: 1 via TOPICAL

## 2020-09-05 MED ORDER — TRAMADOL HCL 50 MG PO TABS: 50.0000 mg | ORAL_TABLET | ORAL | Status: DC | PRN

## 2020-09-05 MED ORDER — FENTANYL CITRATE (PF) 100 MCG/2ML IJ SOLN
INTRAMUSCULAR | Status: AC
  Filled 2020-09-05: qty 2

## 2020-09-05 MED ORDER — TRANEXAMIC ACID-NACL 1000-0.7 MG/100ML-% IV SOLN
INTRAVENOUS | Status: DC | PRN
  Administered 2020-09-05: 1000 mg via INTRAVENOUS

## 2020-09-05 MED ORDER — ONDANSETRON HCL 4 MG/2ML IJ SOLN: 4.0000 mg | Freq: Four times a day (QID) | INTRAMUSCULAR | Status: DC | PRN

## 2020-09-05 MED ORDER — ONDANSETRON HCL 4 MG PO TABS: 4.0000 mg | ORAL_TABLET | Freq: Four times a day (QID) | ORAL | Status: DC | PRN

## 2020-09-05 MED ORDER — BUPIVACAINE LIPOSOME 1.3 % IJ SUSP
INTRAMUSCULAR | Status: AC
  Filled 2020-09-05: qty 20

## 2020-09-05 MED ORDER — GLYCOPYRROLATE 0.2 MG/ML IJ SOLN
INTRAMUSCULAR | Status: AC
  Filled 2020-09-05: qty 1

## 2020-09-05 MED ORDER — ONDANSETRON HCL 4 MG/2ML IJ SOLN
INTRAMUSCULAR | Status: AC
  Filled 2020-09-05: qty 2

## 2020-09-05 MED ORDER — PANTOPRAZOLE SODIUM 40 MG PO TBEC
40.0000 mg | DELAYED_RELEASE_TABLET | Freq: Two times a day (BID) | ORAL | Status: DC
  Administered 2020-09-05 – 2020-09-06 (×2): 40 mg via ORAL
  Filled 2020-09-05 (×2): qty 1

## 2020-09-05 MED ORDER — ACETAMINOPHEN 10 MG/ML IV SOLN
1000.0000 mg | Freq: Four times a day (QID) | INTRAVENOUS | Status: AC
  Administered 2020-09-05 – 2020-09-06 (×3): 1000 mg via INTRAVENOUS
  Filled 2020-09-05 (×4): qty 100

## 2020-09-05 MED ORDER — BISACODYL 10 MG RE SUPP: 10.0000 mg | Freq: Every day | RECTAL | Status: DC | PRN

## 2020-09-05 MED ORDER — METOCLOPRAMIDE HCL 10 MG PO TABS
10.0000 mg | ORAL_TABLET | Freq: Three times a day (TID) | ORAL | Status: DC
  Administered 2020-09-05 – 2020-09-06 (×4): 10 mg via ORAL
  Filled 2020-09-05 (×4): qty 1

## 2020-09-05 MED ORDER — MIDAZOLAM HCL 5 MG/5ML IJ SOLN
INTRAMUSCULAR | Status: DC | PRN
  Administered 2020-09-05: 2 mg via INTRAVENOUS

## 2020-09-05 MED ORDER — HYDROMORPHONE HCL 1 MG/ML IJ SOLN
0.5000 mg | INTRAMUSCULAR | Status: DC | PRN
  Administered 2020-09-05: 1 mg via INTRAVENOUS
  Filled 2020-09-05: qty 1

## 2020-09-05 MED ORDER — SODIUM CHLORIDE 0.9 % IR SOLN
Status: DC | PRN
  Administered 2020-09-05: 500 mL

## 2020-09-05 MED ORDER — CEFAZOLIN SODIUM-DEXTROSE 2-3 GM-%(50ML) IV SOLR
INTRAVENOUS | Status: DC | PRN
  Administered 2020-09-05: 2 g via INTRAVENOUS

## 2020-09-05 MED ORDER — BUPIVACAINE HCL (PF) 0.25 % IJ SOLN
INTRAMUSCULAR | Status: AC
  Filled 2020-09-05: qty 60

## 2020-09-05 MED ORDER — SODIUM CHLORIDE 0.9 % IV SOLN: INTRAVENOUS | Status: DC

## 2020-09-05 MED ORDER — MENTHOL 3 MG MT LOZG
1.0000 | LOZENGE | OROMUCOSAL | Status: DC | PRN
  Filled 2020-09-05: qty 9

## 2020-09-05 MED ORDER — CHLORHEXIDINE GLUCONATE 0.12 % MT SOLN
OROMUCOSAL | Status: AC
  Administered 2020-09-05: 15 mL via OROMUCOSAL
  Filled 2020-09-05: qty 15

## 2020-09-05 MED ORDER — ACETAMINOPHEN 10 MG/ML IV SOLN
INTRAVENOUS | Status: AC
  Filled 2020-09-05: qty 100

## 2020-09-05 MED ORDER — ENSURE PRE-SURGERY PO LIQD
296.0000 mL | Freq: Once | ORAL | Status: DC
  Filled 2020-09-05: qty 296

## 2020-09-05 MED ORDER — BUPIVACAINE HCL (PF) 0.25 % IJ SOLN
INTRAMUSCULAR | Status: DC | PRN
  Administered 2020-09-05: 60 mL

## 2020-09-05 MED ORDER — ENOXAPARIN SODIUM 30 MG/0.3ML ~~LOC~~ SOLN
30.0000 mg | Freq: Two times a day (BID) | SUBCUTANEOUS | Status: DC
  Administered 2020-09-06: 30 mg via SUBCUTANEOUS
  Filled 2020-09-05: qty 0.3

## 2020-09-05 MED ORDER — TRANEXAMIC ACID-NACL 1000-0.7 MG/100ML-% IV SOLN
INTRAVENOUS | Status: AC
  Filled 2020-09-05: qty 100

## 2020-09-05 MED ORDER — ACETAMINOPHEN 325 MG PO TABS: 325.0000 mg | ORAL_TABLET | Freq: Four times a day (QID) | ORAL | Status: DC | PRN

## 2020-09-05 MED ORDER — SODIUM CHLORIDE 0.9 % IV SOLN
INTRAVENOUS | Status: DC | PRN
  Administered 2020-09-05: 60 mL

## 2020-09-05 MED ORDER — PHENYLEPHRINE HCL (PRESSORS) 10 MG/ML IV SOLN
INTRAVENOUS | Status: AC
  Filled 2020-09-05: qty 1

## 2020-09-05 MED ORDER — BUPIVACAINE HCL (PF) 0.5 % IJ SOLN
INTRAMUSCULAR | Status: AC
  Filled 2020-09-05: qty 10

## 2020-09-05 MED ORDER — ALBUTEROL SULFATE HFA 108 (90 BASE) MCG/ACT IN AERS
2.0000 | INHALATION_SPRAY | Freq: Four times a day (QID) | RESPIRATORY_TRACT | Status: DC | PRN
  Filled 2020-09-05: qty 6.7

## 2020-09-05 MED ORDER — FENTANYL CITRATE (PF) 100 MCG/2ML IJ SOLN
INTRAMUSCULAR | Status: DC | PRN
  Administered 2020-09-05: 100 ug via INTRAVENOUS

## 2020-09-05 MED ORDER — OXYCODONE HCL 5 MG PO TABS
5.0000 mg | ORAL_TABLET | ORAL | Status: DC | PRN
  Administered 2020-09-05 – 2020-09-06 (×3): 10 mg via ORAL
  Filled 2020-09-05 (×3): qty 2

## 2020-09-05 MED ORDER — SODIUM CHLORIDE 0.9 % IV SOLN
INTRAVENOUS | Status: DC | PRN
  Administered 2020-09-05: 50 ug/min via INTRAVENOUS
  Administered 2020-09-05: 25 ug/min via INTRAVENOUS

## 2020-09-05 MED ORDER — OXYCODONE HCL 5 MG PO TABS: 5.0000 mg | ORAL_TABLET | Freq: Once | ORAL | Status: DC | PRN

## 2020-09-05 MED ORDER — PROPOFOL 500 MG/50ML IV EMUL
INTRAVENOUS | Status: DC | PRN
  Administered 2020-09-05: 50 ug/kg/min via INTRAVENOUS

## 2020-09-05 SURGICAL SUPPLY — 80 items
ATTUNE PS FEM RT SZ 4 CEM KNEE (Femur) ×2 IMPLANT
ATTUNE PSRP INSR SZ4 5 KNEE (Insert) ×1 IMPLANT
ATTUNE PSRP INSR SZ4 5MM KNEE (Insert) ×1 IMPLANT
BASEPLATE TIBIAL ROTATING SZ 4 (Knees) ×2 IMPLANT
BATTERY INSTRU NAVIGATION (MISCELLANEOUS) ×12 IMPLANT
BLADE SAW 70X12.5 (BLADE) ×3 IMPLANT
BLADE SAW 90X13X1.19 OSCILLAT (BLADE) ×3 IMPLANT
BLADE SAW 90X25X1.19 OSCILLAT (BLADE) ×3 IMPLANT
BONE CEMENT GENTAMICIN (Cement) ×6 IMPLANT
BSPLAT TIB 4 CMNT ROT PLAT STR (Knees) ×1 IMPLANT
BTRY SRG DRVR LF (MISCELLANEOUS) ×4
CANISTER PREVENA PLUS 150 (CANNISTER) ×3 IMPLANT
CANISTER SUCT 3000ML PPV (MISCELLANEOUS) ×3 IMPLANT
CEMENT BONE GENTAMICIN 40 (Cement) IMPLANT
COOLER POLAR GLACIER W/PUMP (MISCELLANEOUS) ×3 IMPLANT
COVER WAND RF STERILE (DRAPES) ×3 IMPLANT
CUFF TOURN SGL QUICK 24 (TOURNIQUET CUFF)
CUFF TOURN SGL QUICK 30 (TOURNIQUET CUFF)
CUFF TRNQT CYL 24X4X16.5-23 (TOURNIQUET CUFF) IMPLANT
CUFF TRNQT CYL 30X4X21-28X (TOURNIQUET CUFF) IMPLANT
DRAPE 3/4 80X56 (DRAPES) ×3 IMPLANT
DRSG DERMACEA 8X12 NADH (GAUZE/BANDAGES/DRESSINGS) ×3 IMPLANT
DRSG MEPILEX SACRM 8.7X9.8 (GAUZE/BANDAGES/DRESSINGS) ×3 IMPLANT
DRSG OPSITE POSTOP 4X14 (GAUZE/BANDAGES/DRESSINGS) ×3 IMPLANT
DRSG TEGADERM 4X4.75 (GAUZE/BANDAGES/DRESSINGS) ×3 IMPLANT
DURAPREP 26ML APPLICATOR (WOUND CARE) ×6 IMPLANT
ELECT REM PT RETURN 9FT ADLT (ELECTROSURGICAL) ×3
ELECTRODE REM PT RTRN 9FT ADLT (ELECTROSURGICAL) ×1 IMPLANT
EX-PIN ORTHOLOCK NAV 4X150 (PIN) ×6 IMPLANT
GLOVE BIO SURGEON STRL SZ7.5 (GLOVE) ×6 IMPLANT
GLOVE BIOGEL M STRL SZ7.5 (GLOVE) ×6 IMPLANT
GLOVE BIOGEL PI IND STRL 7.5 (GLOVE) ×1 IMPLANT
GLOVE BIOGEL PI INDICATOR 7.5 (GLOVE) ×2
GLOVE INDICATOR 8.0 STRL GRN (GLOVE) ×3 IMPLANT
GOWN STRL REUS W/ TWL LRG LVL3 (GOWN DISPOSABLE) ×2 IMPLANT
GOWN STRL REUS W/ TWL XL LVL3 (GOWN DISPOSABLE) ×1 IMPLANT
GOWN STRL REUS W/TWL LRG LVL3 (GOWN DISPOSABLE) ×6
GOWN STRL REUS W/TWL XL LVL3 (GOWN DISPOSABLE) ×3
HEMOVAC 400CC 10FR (MISCELLANEOUS) ×3 IMPLANT
HOLDER FOLEY CATH W/STRAP (MISCELLANEOUS) ×3 IMPLANT
HOOD PEEL AWAY FLYTE STAYCOOL (MISCELLANEOUS) ×6 IMPLANT
IRRIGATION SURGIPHOR STRL (IV SOLUTION) ×3 IMPLANT
KIT PREVENA INCISION MGT20CM45 (CANNISTER) ×3 IMPLANT
KIT PUMP PREVENA PLUS 14DAY (MISCELLANEOUS) ×3 IMPLANT
KIT TURNOVER KIT A (KITS) ×3 IMPLANT
KNIFE SCULPS 14X20 (INSTRUMENTS) ×3 IMPLANT
LABEL OR SOLS (LABEL) ×3 IMPLANT
MANIFOLD NEPTUNE II (INSTRUMENTS) ×6 IMPLANT
NDL SAFETY ECLIPSE 18X1.5 (NEEDLE) ×1 IMPLANT
NDL SPNL 20GX3.5 QUINCKE YW (NEEDLE) ×2 IMPLANT
NEEDLE HYPO 18GX1.5 SHARP (NEEDLE) ×3
NEEDLE SPNL 20GX3.5 QUINCKE YW (NEEDLE) ×6 IMPLANT
NS IRRIG 500ML POUR BTL (IV SOLUTION) ×3 IMPLANT
PACK TOTAL KNEE (MISCELLANEOUS) ×3 IMPLANT
PAD WRAPON POLAR KNEE (MISCELLANEOUS) ×1 IMPLANT
PATELLA MEDIAL ATTUN 35MM KNEE (Knees) ×2 IMPLANT
PENCIL SMOKE ULTRAEVAC 22 CON (MISCELLANEOUS) ×3 IMPLANT
PIN FIXATION 1/8DIA X 3INL (PIN) ×9 IMPLANT
PULSAVAC PLUS IRRIG FAN TIP (DISPOSABLE) ×3
SOL .9 NS 3000ML IRR  AL (IV SOLUTION) ×2
SOL .9 NS 3000ML IRR AL (IV SOLUTION) ×1
SOL .9 NS 3000ML IRR UROMATIC (IV SOLUTION) ×1 IMPLANT
SOL PREP PVP 2OZ (MISCELLANEOUS) ×3
SOLUTION PREP PVP 2OZ (MISCELLANEOUS) ×1 IMPLANT
SPONGE DRAIN TRACH 4X4 STRL 2S (GAUZE/BANDAGES/DRESSINGS) ×3 IMPLANT
STAPLER SKIN PROX 35W (STAPLE) ×3 IMPLANT
STOCKINETTE IMPERV 14X48 (MISCELLANEOUS) IMPLANT
STRAP TIBIA SHORT (MISCELLANEOUS) ×3 IMPLANT
SUCTION FRAZIER HANDLE 10FR (MISCELLANEOUS) ×2
SUCTION TUBE FRAZIER 10FR DISP (MISCELLANEOUS) ×1 IMPLANT
SUT VIC AB 0 CT1 36 (SUTURE) ×6 IMPLANT
SUT VIC AB 1 CT1 36 (SUTURE) ×6 IMPLANT
SUT VIC AB 2-0 CT2 27 (SUTURE) ×3 IMPLANT
SYR 20ML LL LF (SYRINGE) ×3 IMPLANT
SYR 30ML LL (SYRINGE) ×6 IMPLANT
TIP FAN IRRIG PULSAVAC PLUS (DISPOSABLE) ×1 IMPLANT
TOWEL OR 17X26 4PK STRL BLUE (TOWEL DISPOSABLE) ×3 IMPLANT
TOWER CARTRIDGE SMART MIX (DISPOSABLE) ×3 IMPLANT
TRAY FOLEY MTR SLVR 16FR STAT (SET/KITS/TRAYS/PACK) ×3 IMPLANT
WRAPON POLAR PAD KNEE (MISCELLANEOUS) ×3

## 2020-09-05 NOTE — Anesthesia Procedure Notes (Signed)
Spinal  Patient location during procedure: OR Start time: 09/05/2020 12:56 PM Staffing Performed: anesthesiologist  Preanesthetic Checklist Completed: patient identified, IV checked, site marked, risks and benefits discussed, surgical consent, monitors and equipment checked, pre-op evaluation and timeout performed Spinal Block Patient position: sitting Prep: DuraPrep Patient monitoring: heart rate, cardiac monitor, continuous pulse ox and blood pressure Approach: midline Location: L4-5 Injection technique: single-shot Needle Needle type: Sprotte  Needle gauge: 24 G Needle length: 9 cm Assessment Sensory level: T4 Additional Notes Negative heme, negative paresthesia, no pain with injection.

## 2020-09-05 NOTE — Anesthesia Preprocedure Evaluation (Signed)
Anesthesia Evaluation  Patient identified by MRN, date of birth, ID band Patient awake    Reviewed: Allergy & Precautions, H&P , NPO status , Patient's Chart, lab work & pertinent test results  History of Anesthesia Complications Negative for: history of anesthetic complications  Airway Mallampati: III  TM Distance: >3 FB Neck ROM: full    Dental  (+) Chipped, Poor Dentition, Missing   Pulmonary asthma , COPD, Current Smoker and Patient abstained from smoking.,    Pulmonary exam normal        Cardiovascular Exercise Tolerance: Good (-) angina+ CAD and + Past MI  (-) DOE Normal cardiovascular exam     Neuro/Psych PSYCHIATRIC DISORDERS  Neuromuscular disease    GI/Hepatic negative GI ROS, (+) Hepatitis -, C  Endo/Other  negative endocrine ROS  Renal/GU      Musculoskeletal  (+) Arthritis ,   Abdominal   Peds  Hematology negative hematology ROS (+)   Anesthesia Other Findings Past Medical History: No date: Arthritis No date: Asthma No date: Coronary artery disease No date: Depression     Comment:  past No date: Hepatitis     Comment:  C No date: MI (myocardial infarction) (HCC)     Comment:  age in her mid 58's No date: Vitamin D deficiency  Past Surgical History: No date: ANKLE SURGERY     Comment:  metal plate and screws in right ankle.  No date: KNEE SURGERY     Comment:  bilateral knee surgery  No date: TUBAL LIGATION  BMI    Body Mass Index: 22.76 kg/m      Reproductive/Obstetrics negative OB ROS                             Anesthesia Physical Anesthesia Plan  ASA: III  Anesthesia Plan: Spinal   Post-op Pain Management:    Induction:   PONV Risk Score and Plan:   Airway Management Planned: Natural Airway and Nasal Cannula  Additional Equipment:   Intra-op Plan:   Post-operative Plan:   Informed Consent: I have reviewed the patients History and  Physical, chart, labs and discussed the procedure including the risks, benefits and alternatives for the proposed anesthesia with the patient or authorized representative who has indicated his/her understanding and acceptance.     Dental Advisory Given  Plan Discussed with: Anesthesiologist, CRNA and Surgeon  Anesthesia Plan Comments: (Patient reports no bleeding problems and no anticoagulant use.  Plan for spinal with backup GA  Patient consented for risks of anesthesia including but not limited to:  - adverse reactions to medications - damage to eyes, teeth, lips or other oral mucosa - nerve damage due to positioning  - risk of bleeding, infection and or nerve damage from spinal that could lead to paralysis - risk of headache or failed spinal - damage to teeth, lips or other oral mucosa - sore throat or hoarseness - damage to heart, brain, nerves, lungs, other parts of body or loss of life  Patient voiced understanding.)        Anesthesia Quick Evaluation

## 2020-09-05 NOTE — Progress Notes (Signed)
Received report from Larry, RN. Assuming care of patient at this time.  

## 2020-09-05 NOTE — H&P (Signed)
ORTHOPAEDIC HISTORY & PHYSICAL Progress Notes Michelene Gardener, [place] - 08/30/2020 4:00 PM EST [facility] Chief Complaint:   Chief Complaint  Patient presents with  . Knee Pain  H & P RIGHT KNEE   History of Present Illness:   Connie Moreno is a 59 y.o. female that presents to clinic today for her preoperative history and evaluation. Patient presents unaccompanied. The patient is scheduled to undergo a right total knee arthroplasty on 09/05/20 by Dr. Ernest Pine. Her pain began several years ago. The pain is located along the lateral and medial aspects of the right knee. She describes her pain as worse with weightbearing. She reports associated swelling and giving way of the knee. She denies associated numbness or tingling, denies locking.   The patient's symptoms have progressed to the point that they decrease her quality of life. The patient has previously undergone conservative treatment including NSAIDS and injections to the knee without adequate control of her symptoms.  Patient denies history of blood clots, lumbar back surgery, or significant cardiac history. Patient does admit to significant, near daily consumption of alcohol. Of note, patient's previous surgery scheduled for 06/04/2020 was canceled due to presence of cocaine in preoperative testing. Patient states that she is prepared for surgery at this time.  Past Medical, Surgical, Family, Social History, Allergies, Medications:   Past Medical History:  Past Medical History:  Diagnosis Date  . Asthma, unspecified asthma severity, unspecified whether complicated, unspecified whether persistent  . Cardiac murmur  . GERD (gastroesophageal reflux disease)   Past Surgical History:  Past Surgical History:  Procedure Laterality Date  . FRACTURE SURGERY  . KNEE ARTHROSCOPY   Current Medications:  Current Outpatient Medications  Medication Sig Dispense Refill  . naproxen (NAPROSYN)  500 MG tablet Take 500 mg by mouth 2 (two) times daily with meals  . albuterol 90 mcg/actuation inhaler Inhale 2 inhalations into the lungs every 6 (six) hours as needed  . budesonide-formoteroL (SYMBICORT) 80-4.5 mcg/actuation inhaler Inhale 2 inhalations into the lungs 2 (two) times daily  . cetirizine (ZYRTEC) 10 mg capsule Take 10 mg by mouth once daily  . cholecalciferol (VITAMIN D3) 2,000 unit capsule Take 2,000 Units by mouth once daily  . meloxicam (MOBIC) 15 MG tablet Take 15 mg by mouth once daily  . omeprazole (PRILOSEC) 20 MG DR capsule Take 20 mg by mouth once daily   No current facility-administered medications for this visit.   Allergies: No Known Allergies  Social History:  Social History   Socioeconomic History  . Marital status: Single  Spouse name: Not on file  . Number of children: 3  . Years of education: 22  . Highest education level: Not on file  Occupational History  . Occupation: Disabled-due to back issues  Tobacco Use  . Smoking status: Current Every Day Smoker  Packs/day: 0.50  Years: 44.00  Pack years: 22.00  Types: Cigarettes  . Smokeless tobacco: Never Used  . Tobacco comment: Trying to cut back  Substance and Sexual Activity  . Alcohol use: Yes  Alcohol/week: 8.0 standard drinks  Types: 8 Cans of beer per week  . Drug use: Yes  Comment: marijuana 3-4x week  . Sexual activity: Defer  Partners: Male  Other Topics Concern  . Not on file  Social History Narrative  . Not on file   Social Determinants of Health   Financial Resource Strain: Not on file  Food Insecurity: Not on file  Transportation Needs: Not  on file  Physical Activity: Not on file  Stress: Not on file  Social Connections: Not on file  Housing Stability: Not on file   Family History:  Family History  Problem Relation Age of Onset  . High blood pressure (Hypertension) Mother  . Heart disease Mother   Review of Systems:   A 10+ ROS was performed, reviewed, and the  pertinent orthopaedic findings are documented in the HPI.   Physical Examination:   BP 120/84  Ht 170.2 cm (5\' 7" )  Wt 63.2 kg (139 lb 6.4 oz)  BMI 21.83 kg/m   Patient is a well-developed, well-nourished female in no acute distress. Patient has normal mood and affect. Patient is alert and oriented to person, place, and time.   HEENT: Atraumatic, normocephalic. Pupils equal and reactive to light. Extraocular motion intact. Noninjected sclera.  Cardiovascular: Regular rate and rhythm, with no murmurs, rubs, or gallops. Distal pulses palpable.  Respiratory: Lungs clear to auscultation bilaterally.   RightKnee: Soft tissue swelling:mild Effusion:none Erythema:none Crepitance:mild Tenderness:Lateraland medial joint line tenderness Alignment:relative valgus Mediolateral laxity:lateral pseudolaxity Posterior Patellar tracking:Good tracking without evidence of subluxation or tilt Atrophy:No significantatrophy.  Quadriceps tone was fair to good. Range of motion:0/0/100degrees  Sensation intact over the saphenous, lateral sural cutaneous, superficial fibular, and deep fibular nerve distributions. Decreased lT over everything except the superficial distr  Tests Performed/Reviewed:  X-rays  Anteroposterior, lateral, and sunrise views of the right knee were obtained. Images reveal severe loss of lateral compartment joint space with subchondral sclerosis and osteophyte formation noted. Medial compartment reveals moderate loss of joint space without significant osteophyte formation. Mild loss of patellofemoral joint space is noted. No fractures or dislocations.  Impression:   ICD-10-CM  1. Primary osteoarthritis of right knee M17.11   Plan:   The patient has end-stage  degenerative changes of the right knee. It was explained to the patient that the condition is progressive in nature. Having failed conservative treatment, the patient has elected to proceed with a total joint arthroplasty. The patient will undergo a total joint arthroplasty with Dr. 07-16-2002. The risks of surgery, including blood clot and infection, were discussed with the patient. Measures to reduce these risks, including the use of anticoagulation, perioperative antibiotics, and early ambulation were discussed. The importance of postoperative physical therapy was discussed with the patient. The patient elects to proceed with surgery. The patient is instructed to stop all blood thinners prior to surgery. The patient is instructed to call the hospital the day before surgery to learn of the proper arrival time. The importance of a negative drug screen prior to surgery was stressed to the patient.  Contact our office with any questions or concerns. Follow up as indicated, or sooner should any new problems arise, if conditions worsen, or if they are otherwise concerned.   Ernest Pine, PA-C [facility] [address] [place], [place] Phone: [phone]  This note was generated in part with voice recognition software and I apologize for any typographical errors that were not detected and corrected.   Electronically signed by [phone], PA at 08/30/2020 7:16 PM EST

## 2020-09-05 NOTE — H&P (Signed)
The patient has been re-examined, and the chart reviewed, and there have been no interval changes to the documented history and physical.    The risks, benefits, and alternatives have been discussed at length. The patient expressed understanding of the risks benefits and agreed with plans for surgical intervention.  Karver Fadden P. Lakshmi Sundeen, Jr. M.D.    

## 2020-09-05 NOTE — Transfer of Care (Signed)
Immediate Anesthesia Transfer of Care Note  Patient: Connie Moreno  Procedure(s) Performed: COMPUTER ASSISTED TOTAL KNEE ARTHROPLASTY (Right Knee)  Patient Location: PACU  Anesthesia Type:Spinal  Level of Consciousness: drowsy  Airway & Oxygen Therapy: Patient Spontanous Breathing and Patient connected to face mask oxygen  Post-op Assessment: Report given to RN  Post vital signs: stable  Last Vitals:  Vitals Value Taken Time  BP    Temp    Pulse 106 09/05/20 1630  Resp 13 09/05/20 1630  SpO2 100 % 09/05/20 1630  Vitals shown include unvalidated device data.  Last Pain:  Vitals:   09/05/20 1048  TempSrc: Temporal  PainSc: 7          Complications: No complications documented.

## 2020-09-05 NOTE — Op Note (Signed)
OPERATIVE NOTE  DATE OF SURGERY:  09/05/2020  PATIENT NAME:  Connie Moreno   DOB: December 27, 1960  MRN: [id number]  PRE-OPERATIVE DIAGNOSIS: Degenerative arthrosis of the right knee, primary  POST-OPERATIVE DIAGNOSIS:  Same  PROCEDURE:  Right total knee arthroplasty using computer-assisted navigation  SURGEON:  Jena Gauss. M.D.  ASSISTANT: Baldwin Jamaica, PA-C (present and scrubbed throughout the case, critical for assistance with exposure, retraction, instrumentation, and closure)  ANESTHESIA: spinal  ESTIMATED BLOOD LOSS: 25 mL  FLUIDS REPLACED: 1300 mL of crystalloid  TOURNIQUET TIME: 97 minutes  DRAINS: 2 medium Hemovac  SOFT TISSUE RELEASES: Anterior cruciate ligament, posterior cruciate ligament, deep medial collateral ligament, patellofemoral ligament  IMPLANTS UTILIZED: DePuy Attune size 4 posterior stabilized femoral component (cemented), size 4 rotating platform tibial component (cemented), 35 mm medialized dome patella (cemented), and a 5 mm stabilized rotating platform polyethylene insert.  INDICATIONS FOR SURGERY: Connie Moreno is a 59 y.o. year old female with a long history of progressive knee pain. X-rays demonstrated severe degenerative changes in tricompartmental fashion. The patient had not seen any significant improvement despite conservative nonsurgical intervention. After discussion of the risks and benefits of surgical intervention, the patient expressed understanding of the risks benefits and agree with plans for total knee arthroplasty.   The risks, benefits, and alternatives were discussed at length including but not limited to the risks of infection, bleeding, nerve injury, stiffness, blood clots, the need for revision surgery, cardiopulmonary complications, among others, and they were willing to proceed.  PROCEDURE IN DETAIL: The patient was brought into the operating room and, after adequate spinal anesthesia was achieved, a tourniquet was placed on  the patient's upper thigh. The patient's knee and leg were cleaned and prepped with alcohol and DuraPrep and draped in the usual sterile fashion. A "timeout" was performed as per usual protocol. The lower extremity was exsanguinated using an Esmarch, and the tourniquet was inflated to 300 mmHg. An anterior longitudinal incision was made followed by a standard mid vastus approach. The deep fibers of the medial collateral ligament were elevated in a subperiosteal fashion off of the medial flare of the tibia so as to maintain a continuous soft tissue sleeve. The patella was subluxed laterally and the patellofemoral ligament was incised. Inspection of the knee demonstrated severe degenerative changes with full-thickness loss of articular cartilage. Osteophytes were debrided using a rongeur. Anterior and posterior cruciate ligaments were excised. Two 4.0 mm Schanz pins were inserted in the femur and into the tibia for attachment of the array of trackers used for computer-assisted navigation. Hip center was identified using a circumduction technique. Distal landmarks were mapped using the computer. The distal femur and proximal tibia were mapped using the computer. The distal femoral cutting guide was positioned using computer-assisted navigation so as to achieve a 5 distal valgus cut. The femur was sized and it was felt that a size 4 femoral component was appropriate. A size 4 femoral cutting guide was positioned and the anterior cut was performed and verified using the computer. This was followed by completion of the posterior and chamfer cuts. Femoral cutting guide for the central box was then positioned in the center box cut was performed.  Attention was then directed to the proximal tibia. Medial and lateral menisci were excised. The extramedullary tibial cutting guide was positioned using computer-assisted navigation so as to achieve a 0 varus-valgus alignment and 3 posterior slope. The cut was performed and  verified using the computer. The proximal tibia was sized  and it was felt that a size 4 tibial tray was appropriate. Tibial and femoral trials were inserted followed by insertion of a 5 mm polyethylene insert. This allowed for excellent mediolateral soft tissue balancing both in flexion and in full extension. Finally, the patella was cut and prepared so as to accommodate a 35 mm medialized dome patella. A patella trial was placed and the knee was placed through a range of motion with excellent patellar tracking appreciated. The femoral trial was removed after debridement of posterior osteophytes. The central post-hole for the tibial component was reamed followed by insertion of a keel punch. Tibial trials were then removed. Cut surfaces of bone were irrigated with copious amounts of normal saline using pulsatile lavage and then suctioned dry. Polymethylmethacrylate cement with gentamicin was prepared in the usual fashion using a vacuum mixer. Cement was applied to the cut surface of the proximal tibia as well as along the undersurface of a size 4 rotating platform tibial component. Tibial component was positioned and impacted into place. Excess cement was removed using Personal assistant. Cement was then applied to the cut surfaces of the femur as well as along the posterior flanges of the size 4 femoral component. The femoral component was positioned and impacted into place. Excess cement was removed using Personal assistant. A 5 mm polyethylene trial was inserted and the knee was brought into full extension with steady axial compression applied. Finally, cement was applied to the backside of a 35 mm medialized dome patella and the patellar component was positioned and patellar clamp applied. Excess cement was removed using Personal assistant. After adequate curing of the cement, the tourniquet was deflated after a total tourniquet time of 97 minutes. Hemostasis was achieved using electrocautery. The knee was irrigated with  copious amounts of normal saline using pulsatile lavage followed by 500 ml of Surgiphor and then suctioned dry. 20 mL of 1.3% Exparel and 60 mL of 0.25% Marcaine in 40 mL of normal saline was injected along the posterior capsule, medial and lateral gutters, and along the arthrotomy site. A 5 mm stabilized rotating platform polyethylene insert was inserted and the knee was placed through a range of motion with excellent mediolateral soft tissue balancing appreciated and excellent patellar tracking noted. 2 medium drains were placed in the wound bed and brought out through separate stab incisions. The medial parapatellar portion of the incision was reapproximated using interrupted sutures of #1 Vicryl. Subcutaneous tissue was approximated in layers using first #0 Vicryl followed #2-0 Vicryl. The skin was approximated with skin staples. A sterile dressing was applied.  The patient tolerated the procedure well and was transported to the recovery room in stable condition.    Carthel Castille P. Angie Fava., M.D.

## 2020-09-06 ENCOUNTER — Encounter: Payer: Self-pay | Admitting: Orthopedic Surgery

## 2020-09-06 ENCOUNTER — Telehealth: Payer: Self-pay | Admitting: Nurse Practitioner

## 2020-09-06 MED ORDER — CELECOXIB 200 MG PO CAPS: 200.0000 mg | ORAL_CAPSULE | Freq: Two times a day (BID) | ORAL | 0 refills | Status: DC

## 2020-09-06 MED ORDER — TRAMADOL HCL 50 MG PO TABS: 50.0000 mg | ORAL_TABLET | ORAL | 0 refills | Status: DC | PRN

## 2020-09-06 MED ORDER — ENOXAPARIN SODIUM 40 MG/0.4ML ~~LOC~~ SOLN: 40.0000 mg | SUBCUTANEOUS | 0 refills | Status: AC

## 2020-09-06 MED ORDER — OXYCODONE HCL 5 MG PO TABS: 5.0000 mg | ORAL_TABLET | ORAL | 0 refills | Status: DC | PRN

## 2020-09-06 NOTE — Plan of Care (Signed)
  Problem: Education: Goal: Knowledge of General Education information will improve Description: Including pain rating scale, medication(s)/side effects and non-pharmacologic comfort measures 09/06/2020 1247 by Ansel Bong, RN Outcome: Progressing 09/06/2020 1247 by Ansel Bong, RN Outcome: Progressing   Problem: Health Behavior/Discharge Planning: Goal: Ability to manage health-related needs will improve 09/06/2020 1247 by Ansel Bong, RN Outcome: Progressing 09/06/2020 1247 by Ansel Bong, RN Outcome: Progressing   Problem: Clinical Measurements: Goal: Ability to maintain clinical measurements within normal limits will improve 09/06/2020 1247 by Ansel Bong, RN Outcome: Progressing 09/06/2020 1247 by Ansel Bong, RN Outcome: Progressing Goal: Will remain free from infection 09/06/2020 1247 by Ansel Bong, RN Outcome: Progressing 09/06/2020 1247 by Ansel Bong, RN Outcome: Progressing Goal: Diagnostic test results will improve 09/06/2020 1247 by Ansel Bong, RN Outcome: Progressing 09/06/2020 1247 by Ansel Bong, RN Outcome: Progressing Goal: Respiratory complications will improve 09/06/2020 1247 by Ansel Bong, RN Outcome: Progressing 09/06/2020 1247 by Ansel Bong, RN Outcome: Progressing Goal: Cardiovascular complication will be avoided 09/06/2020 1247 by Ansel Bong, RN Outcome: Progressing 09/06/2020 1247 by Ansel Bong, RN Outcome: Progressing   Problem: Activity: Goal: Risk for activity intolerance will decrease 09/06/2020 1247 by Ansel Bong, RN Outcome: Progressing 09/06/2020 1247 by Ansel Bong, RN Outcome: Progressing   Problem: Nutrition: Goal: Adequate nutrition will be maintained 09/06/2020 1247 by Ansel Bong, RN Outcome: Progressing 09/06/2020 1247 by Ansel Bong, RN Outcome: Progressing   Problem: Coping: Goal: Level of anxiety will decrease 09/06/2020 1247 by Ansel Bong, RN Outcome:  Progressing 09/06/2020 1247 by Ansel Bong, RN Outcome: Progressing   Problem: Elimination: Goal: Will not experience complications related to bowel motility 09/06/2020 1247 by Ansel Bong, RN Outcome: Progressing 09/06/2020 1247 by Ansel Bong, RN Outcome: Progressing Goal: Will not experience complications related to urinary retention 09/06/2020 1247 by Ansel Bong, RN Outcome: Progressing 09/06/2020 1247 by Ansel Bong, RN Outcome: Progressing   Problem: Pain Managment: Goal: General experience of comfort will improve 09/06/2020 1247 by Ansel Bong, RN Outcome: Progressing 09/06/2020 1247 by Ansel Bong, RN Outcome: Progressing   Problem: Safety: Goal: Ability to remain free from injury will improve 09/06/2020 1247 by Ansel Bong, RN Outcome: Progressing 09/06/2020 1247 by Ansel Bong, RN Outcome: Progressing   Problem: Skin Integrity: Goal: Risk for impaired skin integrity will decrease 09/06/2020 1247 by Ansel Bong, RN Outcome: Progressing 09/06/2020 1247 by Ansel Bong, RN Outcome: Progressing   Problem: Education: Goal: Knowledge of the prescribed therapeutic regimen will improve 09/06/2020 1247 by Ansel Bong, RN Outcome: Progressing 09/06/2020 1247 by Ansel Bong, RN Outcome: Progressing Goal: Individualized Educational Video(s) 09/06/2020 1247 by Ansel Bong, RN Outcome: Progressing 09/06/2020 1247 by Ansel Bong, RN Outcome: Progressing   Problem: Activity: Goal: Ability to avoid complications of mobility impairment will improve 09/06/2020 1247 by Ansel Bong, RN Outcome: Progressing 09/06/2020 1247 by Ansel Bong, RN Outcome: Progressing Goal: Range of joint motion will improve 09/06/2020 1247 by Ansel Bong, RN Outcome: Progressing 09/06/2020 1247 by Ansel Bong, RN Outcome: Progressing   Problem: Clinical Measurements: Goal: Postoperative complications will be avoided or  minimized 09/06/2020 1247 by Ansel Bong, RN Outcome: Progressing 09/06/2020 1247 by Ansel Bong, RN Outcome: Progressing   Problem: Pain Management: Goal: Pain level will decrease with appropriate interventions 09/06/2020 1247 by Ansel Bong, RN Outcome: Progressing 09/06/2020 1247 by Ansel Bong, RN Outcome: Progressing   Problem: Skin Integrity: Goal: Will show signs of wound healing 09/06/2020 1247 by Ansel Bong, RN Outcome: Progressing 09/06/2020 1247 by Ansel Bong, RN Outcome: Progressing

## 2020-09-06 NOTE — Anesthesia Postprocedure Evaluation (Signed)
Anesthesia Post Note  Patient: Connie Moreno  Procedure(s) Performed: COMPUTER ASSISTED TOTAL KNEE ARTHROPLASTY (Right Knee)  Patient location during evaluation: Nursing Unit Anesthesia Type: Spinal Level of consciousness: oriented and awake and alert Pain management: pain level controlled Vital Signs Assessment: post-procedure vital signs reviewed and stable Respiratory status: spontaneous breathing and respiratory function stable Cardiovascular status: blood pressure returned to baseline and stable Postop Assessment: no headache, no backache, no apparent nausea or vomiting and patient able to bend at knees Anesthetic complications: no   No complications documented.   Last Vitals:  Vitals:   09/06/20 0314 09/06/20 0744  BP: (!) 138/58 (!) 108/92  Pulse: (!) 45 (!) 55  Resp: 17 16  Temp: 36.6 C 36.9 C  SpO2: 98% 100%    Last Pain:  Vitals:   09/06/20 0744  TempSrc: Oral  PainSc:                  Rosanne Gutting

## 2020-09-06 NOTE — Progress Notes (Signed)
This RN provided discharge instructions and teaching to the pt. The pt demonstrated and verbalized understanding of the provided instructions. Wound care instructions provided. Pt demonstrated understanding. 2 [employer] dressings provided. L Arm PIV removed. Cannula intact. Pt tolerated well. All belongings packed and in tow. [place], CNA transported pt to private vehicle via wheelchair.

## 2020-09-06 NOTE — TOC Initial Note (Signed)
Transition of Care [facility]) - Initial/Assessment Note    Patient Details  Name: Connie Moreno MRN: [id number] Date of Birth: 01-30-1961  Transition of Care [facility]) CM/SW Contact:    Shelbie Ammons, RN Phone Number: 09/06/2020, 3:27 PM  Clinical Narrative:    RNCM met with patient at bedside. Patient reports to feeling well today and that she has been up and walked to nurses station and back. Patient reports that she has already been contacted by [facility] to come out for home health when she gets home and that she has all necessary equipment except for a rolling walker which she is agreeable too. RNCM reached out to [place] with [facility] and they already have patient ready. RNCM reached out to [facility] with [employer] for rolling walker.                Expected Discharge Plan: [place] Barriers to Discharge: No Barriers Identified   Patient Goals and CMS Choice     Choice offered to / list presented to : Patient  Expected Discharge Plan and Services Expected Discharge Plan: [place] Acute Care Choice: [place] arrangements for the past 2 months: Single Family Home                 DME Arranged: Walker rolling DME Agency: [employer] Date DME Agency Contacted: 09/06/20 Time DME Agency Contacted: [id number] Representative spoke with at DME Agency: Mardene Celeste HH Arranged: PT, OT [place] Agency: [facility] (formerly [employer]) Date [place]: 09/06/20 Time HH Agency Contacted: 41 Representative spoke with at [place]: [place] Arrangements/Services Living arrangements for the past 2 months: [place] with:: Self Patient language and need for interpreter reviewed:: Yes Do you feel safe going back to the place where you live?: Yes      Need for Family Participation in Patient Care: Yes (Comment) Care giver support system in place?: No (comment)   Criminal Activity/Legal Involvement  Pertinent to Current Situation/Hospitalization: No - Comment as needed  Activities of Daily Living Home Assistive Devices/Equipment: Splint (specify type), Eyeglasses, Dentures (specify type), Cane (specify quad or straight), Walker (specify type), Raised toilet seat with rails, Grab bars in shower, Shower chair without back, Bedside commode/3-in-1 ADL Screening (condition at time of admission) Patient's cognitive ability adequate to safely complete daily activities?: Yes Is the patient deaf or have difficulty hearing?: No Does the patient have difficulty seeing, even when wearing glasses/contacts?: No Does the patient have difficulty concentrating, remembering, or making decisions?: No Patient able to express need for assistance with ADLs?: Yes Does the patient have difficulty dressing or bathing?: No Independently performs ADLs?: Yes (appropriate for developmental age) Does the patient have difficulty walking or climbing stairs?: Yes Weakness of Legs: Both Weakness of Arms/Hands: None  Permission Sought/Granted                  Emotional Assessment Appearance:: Appears stated age     Orientation: : Oriented to Self, Oriented to Place, Oriented to  Time, Oriented to Situation Alcohol / Substance Use: Not Applicable Psych Involvement: No (comment)  Admission diagnosis:  Total knee replacement status [Z96.659] Patient Active Problem List   Diagnosis Date Noted  . Total knee replacement status 09/05/2020  . Airway hyperreactivity 06/03/2020  . Arthritis of knee, degenerative 06/03/2020  . Arthritis of shoulder region, degenerative 06/03/2020  . Clinical depression 06/03/2020  . H/O  acute myocardial infarction 06/03/2020  . HBV (hepatitis B virus) infection 06/03/2020  . Heart disease 06/03/2020  . Hepatitis 06/03/2020  . 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 Nocturnal leg cramps 05/26/2016  . Vitamin D deficiency 05/26/2016  . Chronic knee pain  05/26/2016  . Current tobacco use 11/24/2014  . SOB (shortness of breath) 11/24/2014  . Asthma, moderate persistent 11/24/2014  . Substance abuse in remission ([place]) 11/24/2014  . Bilateral leg edema 11/24/2014   PCP:  Mikey College, NP (Inactive) Pharmacy:   [facility] [address] ([address]), [place]) [place] [address] Phone: [phone] Fax: [phone]     Social Determinants of Health (SDOH) Interventions    Readmission Risk Interventions No flowsheet data found.

## 2020-09-06 NOTE — Progress Notes (Signed)
Physical Therapy Treatment Patient Details Name: Connie Moreno MRN: [id number] DOB: 04-05-1961 Today's Date: 09/06/2020    History of Present Illness Pt is a 59 y.o. 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  PMH includes asthma, h/o MI, Hep C, COPD, h/o ankle surgery, h/o B knee surgery.    PT Comments    Able to complete full lap around nursing station (200') with RW and initiated curb/stair negotiation with RW, cga/close throughout.  Min cuing for normalized mechanics to R LE throughout mobility tasks.  Good pain control, good effort/participation with activities.  Moderately impulsive, but redirectable and with good response to education throughout session.    Follow Up Recommendations  Home health PT     Equipment Recommendations  Rolling walker with 5" wheels    Recommendations for Other Services       Precautions / Restrictions Precautions Precautions: Knee;Fall Restrictions Weight Bearing Restrictions: Yes RLE Weight Bearing: Weight bearing as tolerated    Mobility  Bed Mobility Overal bed mobility: Modified Independent                Transfers Overall transfer level: Needs assistance Equipment used: Rolling walker (2 wheeled) Transfers: Sit to/from Stand Sit to Stand: Modified independent (Device/Increase time);Supervision         General transfer comment: fair recall of education for hand placement; limited active use of R LE with movement transition, mantains anterior to BOS with sit/stand and stand/sit  Ambulation/Gait Ambulation/Gait assistance: Supervision Gait Distance (Feet): 200 Feet Assistive device: Rolling walker (2 wheeled)   Gait velocity: 10' walk time, 11-12 seconds   General Gait Details: exaggerated step length of R LE, improved with cuing for normalized step height/length; min cuing for R LE heel strike/toe off to facilitate improved knee flexion in swing phase.  Fair cadence/gait speed; mildly impuslive at times  (with turns)   Stairs Stairs: Yes   Stair Management: With walker Number of Stairs:  (1 platform step x2 reps) General stair comments: min cuing for technique; good return demonstration of safe, appropriate and controlled stair negotiation with RW   Wheelchair Mobility    Modified Rankin (Stroke Patients Only)       Balance Overall balance assessment: Needs assistance Sitting-balance support: Feet supported;No upper extremity supported Sitting balance-Leahy Scale: Good     Standing balance support: Bilateral upper extremity supported Standing balance-Leahy Scale: Fair                              Cognition Arousal/Alertness: Awake/alert Behavior During Therapy: WFL for tasks assessed/performed Overall Cognitive Status: Within Functional Limits for tasks assessed                                 General Comments: Moderately impulsive throughout session      Exercises Other Exercises Other Exercises: Issued handout with written/pictorial descriptions of supine LE therex; encouraged performance 3x/day for progressive LE strength/flexibility. Other Exercises: Verbally reviewed car transfer technique; patient indep verbalizes understanding of safe sequencing.    General Comments        Pertinent Vitals/Pain Pain Assessment: Faces Faces Pain Scale: Hurts a little bit Pain Location: R knee Pain Descriptors / Indicators: Aching;Guarding Pain Intervention(s): Limited activity within patient's tolerance;Monitored during session;Premedicated before session;Repositioned    Home Living  Prior Function            PT Goals (current goals can now be found in the care plan section) Acute Rehab PT Goals Patient Stated Goal: to go home today PT Goal Formulation: With patient Time For Goal Achievement: 09/20/20 Potential to Achieve Goals: Good Progress towards PT goals: Progressing toward goals    Frequency     BID      PT Plan Current plan remains appropriate    Co-evaluation              AM-PAC PT "6 Clicks" Mobility   Outcome Measure  Help needed turning from your back to your side while in a flat bed without using bedrails?: None Help needed moving from lying on your back to sitting on the side of a flat bed without using bedrails?: None Help needed moving to and from a bed to a chair (including a wheelchair)?: None Help needed standing up from a chair using your arms (e.g., wheelchair or bedside chair)?: None Help needed to walk in hospital room?: A Little Help needed climbing 3-5 steps with a railing? : A Little 6 Click Score: 22    End of Session Equipment Utilized During Treatment: Gait belt Activity Tolerance: Patient tolerated treatment well Patient left: in bed;with bed alarm set;with call bell/phone within reach   PT Visit Diagnosis: Other abnormalities of gait and mobility (R26.89);History of falling (Z91.81);Pain Pain - Right/Left: Right Pain - part of body: Knee     Time: 4496-7591 PT Time Calculation (min) (ACUTE ONLY): 24 min  Charges:  $Gait Training: 8-22 mins $Therapeutic Activity: 8-22 mins                     \Connie Moreno H. Manson Passey, PT, DPT, NCS 09/06/20, 5:06 PM [phone]

## 2020-09-06 NOTE — Telephone Encounter (Signed)
Connie Moreno calling from Dr. Ernest Pine office is calling to make Connie Moreno know that the patient has had hepatitis and has not been treated.  A fax sheet was also sent. Please Cb- [phone]

## 2020-09-06 NOTE — Evaluation (Signed)
Occupational Therapy Evaluation Patient Details Name: Connie Moreno MRN: [id number] DOB: 07-18-1961 Today's Date: 09/06/2020    History of Present Illness Pt is a 59 y.o. 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  PMH includes asthma, h/o MI, Hep C, COPD, h/o ankle surgery, h/o B knee surgery.   Clinical Impression   Patient presenting with decreased I in self care, balance, functional mobility, transfers, endurance, and safety awareness.  Patient reports living alone in an apartment with use of SPC as needed for independence PTA. Patient currently functioning at supervision - min guard with self care tasks and functional transfers. Pt is very motivated for therapeutic intervention. OT provided education and hand out regarding polar care use and AE with self care. Pt verbalized understanding. Pt reports her daughter will be staying with her to provide assistance as needed.Patient will benefit from acute OT to increase overall independence in the areas of ADLs, functional mobility, and safety awareness in order to safely discharge home with family.    Follow Up Recommendations  No OT follow up;Supervision - Intermittent    Equipment Recommendations  None recommended by OT       Precautions / Restrictions Precautions Precautions: Knee;Fall Precaution Booklet Issued: Yes (comment) Restrictions Weight Bearing Restrictions: Yes RLE Weight Bearing: Weight bearing as tolerated      Mobility Bed Mobility Overal bed mobility: Modified Independent      General bed mobility comments: sitting in recliner chair upon entering the room    Transfers Overall transfer level: Needs assistance Equipment used: Rolling walker (2 wheeled) Transfers: Sit to/from Stand;Stand Pivot Transfers Sit to Stand: Min guard Stand pivot transfers: Min guard       General transfer comment: vc's for UE/LE placement with transfers; x1 trial from bed and x1 trial from recliner    Balance  Overall balance assessment: Needs assistance Sitting-balance support: No upper extremity supported;Feet supported Sitting balance-Leahy Scale: Normal Sitting balance - Comments: steady sitting reaching outside BOS   Standing balance support: Single extremity supported Standing balance-Leahy Scale: Fair Standing balance comment: pt steady standing with at least single UE support        ADL either performed or assessed with clinical judgement   ADL Overall ADL's : Needs assistance/impaired Eating/Feeding: Independent   Grooming: Wash/dry hands;Wash/dry face;Oral care;Sitting;Set up   Upper Body Bathing: Set up;Sitting   Lower Body Bathing: Min guard;Sit to/from stand   Upper Body Dressing : Set up;Sitting   Lower Body Dressing: Min guard;Sit to/from stand   Toilet Transfer: Min guard;RW   Toileting- Architect and Hygiene: Min guard;Sit to/from stand         General ADL Comments: Pt also reports owning LH reacher and OT providing education on how pt could utilize to increase independence and safety with self care tasks     Vision Patient Visual Report: No change from baseline              Pertinent Vitals/Pain Pain Assessment: No/denies pain Pain Score: 0-No pain Pain Location: R knee Pain Intervention(s): Limited activity within patient's tolerance;Monitored during session;Premedicated before session;Repositioned;Other (comment) (polar care applied and activated)     Hand Dominance Right   Extremity/Trunk Assessment Upper Extremity Assessment Upper Extremity Assessment: Overall WFL for tasks assessed   Lower Extremity Assessment Lower Extremity Assessment: RLE deficits/detail RLE Deficits / Details: at least 3/5 AROM ankle DF/PF; able to perform R LE SLR independently RLE: Unable to fully assess due to pain   Cervical / Trunk Assessment  Cervical / Trunk Assessment: Normal   Communication Communication Communication: No difficulties    Cognition Arousal/Alertness: Awake/alert Behavior During Therapy: WFL for tasks assessed/performed Overall Cognitive Status: Within Functional Limits for tasks assessed       General Comments: Eager to participate in therapy   General Comments  R LE dressing, hemovac, and polar care in place beginning/end of session    Exercises Total Joint Exercises Ankle Circles/Pumps: AROM;Strengthening;Both;10 reps;Supine Quad Sets: AROM;Strengthening;Both;10 reps;Supine Short Arc Quad: AROM;Strengthening;Right;10 reps;Supine Heel Slides: AAROM;Strengthening;Right;10 reps;Supine Hip ABduction/ADduction: AAROM;Strengthening;Right;10 reps;Supine (assist for correct positioning with ex) Straight Leg Raises: AROM;Strengthening;Both;10 reps;Supine Goniometric ROM: R knee AROM 0-90 degrees   Shoulder Instructions      Home Living Family/patient expects to be discharged to:: Private residence Living Arrangements: Alone Available Help at Discharge: Family;Available PRN/intermittently Type of Home: House Home Access: Stairs to enter [employer] of Steps: 1 Entrance Stairs-Rails: None Home Layout: One level     Bathroom Shower/Tub: Chief Strategy Officer: Standard     Home Equipment: Environmental consultant - 4 wheels;Cane - single point;Bedside commode;Shower seat;Toilet riser;Grab bars - tub/shower          Prior Functioning/Environment Level of Independence: Independent with assistive device(s)        Comments: Ambulatory with SPC.  H/o 1 fall in past 6 months.        OT Problem List: Decreased strength;Decreased safety awareness;Impaired balance (sitting and/or standing);Decreased activity tolerance;Decreased knowledge of use of DME or AE;Decreased range of motion      OT Treatment/Interventions: Self-care/ADL training;Therapeutic exercise;Therapeutic activities;Energy conservation;DME and/or AE instruction;Patient/family education;Balance training    OT Goals(Current  goals can be found in the care plan section) Acute Rehab OT Goals Patient Stated Goal: to go home today OT Goal Formulation: With patient Time For Goal Achievement: 09/20/20 Potential to Achieve Goals: Good ADL Goals Pt Will Perform Lower Body Dressing: with modified independence;sit to/from stand Pt Will Transfer to Toilet: with modified independence;ambulating Pt Will Perform Toileting - Clothing Manipulation and hygiene: with modified independence;sit to/from stand  OT Frequency: Min 2X/week   Barriers to D/C:    none known at this time          AM-PAC OT "6 Clicks" Daily Activity     Outcome Measure Help from another person eating meals?: None Help from another person taking care of personal grooming?: None Help from another person toileting, which includes using toliet, bedpan, or urinal?: A Little Help from another person bathing (including washing, rinsing, drying)?: A Little Help from another person to put on and taking off regular upper body clothing?: None Help from another person to put on and taking off regular lower body clothing?: A Little 6 Click Score: 21   End of Session Nurse Communication: Mobility status  Activity Tolerance: Patient tolerated treatment well Patient left: in bed  OT Visit Diagnosis: Muscle weakness (generalized) (M62.81);Repeated falls (R29.6);History of falling (Z91.81)                Time: 6063-0160 OT Time Calculation (min): 19 min Charges:  OT General Charges $OT Visit: 1 Visit OT Evaluation $OT Eval Low Complexity: 1 Low OT Treatments $Self Care/Home Management : 8-22 mins  Jackquline Denmark, MS, OTR/L , CBIS ascom [phone]  09/06/20, 10:49 AM

## 2020-09-06 NOTE — Evaluation (Signed)
Physical Therapy Evaluation Patient Details Name: Connie Moreno MRN: [id number] DOB: July 13, 1961 Today's Date: 09/06/2020   History of Present Illness  Pt is a 59 y.o. 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  PMH includes asthma, h/o MI, Hep C, COPD, h/o ankle surgery, h/o B knee surgery.  Clinical Impression  Prior to hospital admission, pt was modified independent ambulating with SPC; lives alone in 1 level home with 1 STE (pt's youngest daughter planning on staying with pt for first week home).  Currently pt is modified independent semi-supine to sitting edge of bed; CGA with transfers; and CGA ambulating 90 feet with RW.  Pt reporting no R knee pain during sessions activities and hopeful for discharge home today.  Able to perform R LE SLR independently and R knee AROM 0-90 degrees.  Pt would benefit from skilled PT to address noted impairments and functional limitations (see below for any additional details).  Upon hospital discharge, pt would benefit from HHPT.    Follow Up Recommendations Home health PT    Equipment Recommendations  Rolling walker with 5" wheels    Recommendations for Other Services       Precautions / Restrictions Precautions Precautions: Knee;Fall Precaution Booklet Issued: Yes (comment) Restrictions Weight Bearing Restrictions: Yes RLE Weight Bearing: Weight bearing as tolerated      Mobility  Bed Mobility Overal bed mobility: Modified Independent             General bed mobility comments: Supine to sit without any noted difficulties    Transfers Overall transfer level: Needs assistance Equipment used: Rolling walker (2 wheeled) Transfers: Sit to/from [employer] Sit to Stand: Min guard Stand pivot transfers: Min guard (stand step turn bed to recliner with RW)       General transfer comment: vc's for UE/LE placement with transfers; x1 trial from bed and x1 trial from  recliner  Ambulation/Gait Ambulation/Gait assistance: Min guard Gait Distance (Feet): 90 Feet Assistive device: Rolling walker (2 wheeled)   Gait velocity: decreased   General Gait Details: partial step through gait pattern; steady with RW; initial vc's for stepping technique and use of walker; mild decreased stance time R LE  Stairs            Wheelchair Mobility    Modified Rankin (Stroke Patients Only)       Balance Overall balance assessment: Needs assistance Sitting-balance support: No upper extremity supported;Feet supported Sitting balance-Leahy Scale: Normal Sitting balance - Comments: steady sitting reaching outside BOS   Standing balance support: Single extremity supported Standing balance-Leahy Scale: Fair Standing balance comment: pt steady standing with at least single UE support                             Pertinent Vitals/Pain Pain Assessment: 0-10 Pain Score: 0-No pain Pain Location: R knee Pain Intervention(s): Limited activity within patient's tolerance;Monitored during session;Premedicated before session;Repositioned;Other (comment) (polar care applied and activated)  Vitals (HR and O2 on room air) stable and WFL throughout treatment session.    Home Living Family/patient expects to be discharged to:: Private residence Living Arrangements: Alone Available Help at Discharge: Family (pt's youngest daughter planning on staying with pt for a week after surgery) Type of Home: House Home Access: Stairs to enter Entrance Stairs-Rails: None Entrance Stairs-Number of Steps: 1 Home Layout: One level Home Equipment: Walker - 4 wheels;Cane - single point;Bedside commode;Shower seat;Toilet riser;Grab bars - tub/shower  Prior Function Level of Independence: Independent with assistive device(s)         Comments: Ambulatory with SPC.  H/o 1 fall in past 6 months.     Hand Dominance        Extremity/Trunk Assessment   Upper  Extremity Assessment Upper Extremity Assessment: Overall WFL for tasks assessed    Lower Extremity Assessment Lower Extremity Assessment: RLE deficits/detail (L LE WFL) RLE Deficits / Details: at least 3/5 AROM ankle DF/PF; able to perform R LE SLR independently RLE: Unable to fully assess due to pain    Cervical / Trunk Assessment Cervical / Trunk Assessment: Normal  Communication   Communication: No difficulties  Cognition Arousal/Alertness: Awake/alert Behavior During Therapy: WFL for tasks assessed/performed Overall Cognitive Status: Within Functional Limits for tasks assessed                                 General Comments: Eager to participate in therapy      General Comments General comments (skin integrity, edema, etc.): R LE dressing, hemovac, and polar care in place beginning/end of session.  Pt agreeable to PT session.    Exercises Total Joint Exercises Ankle Circles/Pumps: AROM;Strengthening;Both;10 reps;Supine Quad Sets: AROM;Strengthening;Both;10 reps;Supine Short Arc Quad: AROM;Strengthening;Right;10 reps;Supine Heel Slides: AAROM;Strengthening;Right;10 reps;Supine Hip ABduction/ADduction: AAROM;Strengthening;Right;10 reps;Supine (assist for correct positioning with ex) Straight Leg Raises: AROM;Strengthening;Both;10 reps;Supine Goniometric ROM: R knee AROM 0-90 degrees   Assessment/Plan    PT Assessment Patient needs continued PT services  PT Problem List Decreased strength;Decreased range of motion;Decreased activity tolerance;Decreased balance;Decreased mobility;Decreased knowledge of use of DME;Decreased knowledge of precautions;Pain;Decreased skin integrity       PT Treatment Interventions DME instruction;Gait training;Stair training;Functional mobility training;Therapeutic activities;Therapeutic exercise;Balance training;Patient/family education    PT Goals (Current goals can be found in the Care Plan section)  Acute Rehab PT  Goals Patient Stated Goal: to go home today PT Goal Formulation: With patient Time For Goal Achievement: 09/20/20 Potential to Achieve Goals: Fair    Frequency BID   Barriers to discharge        Co-evaluation               AM-PAC PT "6 Clicks" Mobility  Outcome Measure Help needed turning from your back to your side while in a flat bed without using bedrails?: None Help needed moving from lying on your back to sitting on the side of a flat bed without using bedrails?: None Help needed moving to and from a bed to a chair (including a wheelchair)?: A Little Help needed standing up from a chair using your arms (e.g., wheelchair or bedside chair)?: A Little Help needed to walk in hospital room?: A Little Help needed climbing 3-5 steps with a railing? : A Little 6 Click Score: 20    End of Session Equipment Utilized During Treatment: Gait belt Activity Tolerance: Patient tolerated treatment well Patient left: in chair;with call bell/phone within reach;with chair alarm set;with SCD's reapplied;Other (comment) (B heels floating via towel rolls; polar care in place and activated) Nurse Communication: Mobility status;Precautions;Weight bearing status (via white board) PT Visit Diagnosis: Other abnormalities of gait and mobility (R26.89);History of falling (Z91.81);Pain Pain - Right/Left: Right Pain - part of body: Knee    Time: 2505-3976 PT Time Calculation (min) (ACUTE ONLY): 35 min   Charges:   PT Evaluation $PT Eval Low Complexity: 1 Low PT Treatments $Therapeutic Exercise: 8-22 mins  Hendricks Limes, PT 09/06/20, 10:18 AM

## 2020-09-06 NOTE — Progress Notes (Signed)
  Subjective: 1 Day Post-Op Procedure(s) (LRB): COMPUTER ASSISTED TOTAL KNEE ARTHROPLASTY (Right) Patient reports pain as well-controlled.   Patient is well, and has had no acute complaints or problems Plan is to go Home after hospital stay. Negative for chest pain and shortness of breath Fever: no Gastrointestinal: negative for nausea and vomiting.  Patient has not had a bowel movement.  Objective: Vital signs in last 24 hours: Temp:  [96.9 F (36.1 C)-98.4 F (36.9 C)] 98.4 F (36.9 C) (11/18 0744) Pulse Rate:  [45-106] 55 (11/18 0744) Resp:  [13-29] 16 (11/18 0744) BP: (108-181)/(44-97) 108/92 (11/18 0744) SpO2:  [96 %-100 %] 100 % (11/18 0744) Weight:  [64 kg] 64 kg (11/17 1048)  Intake/Output from previous day:  Intake/Output Summary (Last 24 hours) at 09/06/2020 0822 Last data filed at 09/05/2020 2123 Gross per 24 hour  Intake 1840 ml  Output 675 ml  Net 1165 ml    Intake/Output this shift: No intake/output data recorded.  Labs: No results for input(s): HGB in the last 72 hours. No results for input(s): WBC, RBC, HCT, PLT in the last 72 hours. No results for input(s): NA, K, CL, CO2, BUN, CREATININE, GLUCOSE, CALCIUM in the last 72 hours. No results for input(s): LABPT, INR in the last 72 hours.   EXAM General - Patient is Alert, Appropriate and Oriented Extremity - Neurovascular intact Dorsiflexion/Plantar flexion intact Compartment soft; bed with knees flexed and no Bone Foam in place Dressing/Incision -Postoperative dressing remains in place., Polar Care in place and working. , Hemovac in place.  Motor Function - intact, moving foot and toes well on exam.  Cardiovascular- Regular rate and rhythm, no murmurs/rubs/gallops Respiratory- Lungs clear to auscultation bilaterally Gastrointestinal- soft, nontender and active bowel sounds   Assessment/Plan: 1 Day Post-Op Procedure(s) (LRB): COMPUTER ASSISTED TOTAL KNEE ARTHROPLASTY (Right) Active Problems:    Total knee replacement status  Estimated body mass index is 22.76 kg/m as calculated from the following:   Height as of this encounter: 5\' 6"  (1.676 m).   Weight as of this encounter: 64 kg. Advance diet Up with therapy Plan for discharge tomorrow  Bone Foam placed. Bed placed in flat position and locked.  DVT Prophylaxis - Lovenox, Ted hose and foot pumps Weight-Bearing as tolerated to right leg  , PA-C [facility] Orthopaedic Surgery 09/06/2020, 8:22 AM

## 2020-09-06 NOTE — Discharge Summary (Signed)
Physician Discharge Summary  Patient ID: Connie Moreno MRN: [id number] DOB/AGE: 1961/03/25 59 y.o.  Admit date: 09/05/2020 Discharge date: 09/06/2020  Admission Diagnoses:  Total knee replacement status [Z96.659]  Surgeries:Procedure(s):  Right total knee arthroplasty using computer-assisted navigation  SURGEON:  Jena Gauss. M.D.  ASSISTANT: Baldwin Jamaica, PA-C (present and scrubbed throughout the case, critical for assistance with exposure, retraction, instrumentation, and closure)  ANESTHESIA: spinal  ESTIMATED BLOOD LOSS: 25 mL  FLUIDS REPLACED: 1300 mL of crystalloid  TOURNIQUET TIME: 97 minutes  DRAINS: 2 medium Hemovac  SOFT TISSUE RELEASES: Anterior cruciate ligament, posterior cruciate ligament, deep medial collateral ligament, patellofemoral ligament  IMPLANTS UTILIZED: DePuy Attune size 4 posterior stabilized femoral component (cemented), size 4 rotating platform tibial component (cemented), 35 mm medialized dome patella (cemented), and a 5 mm stabilized rotating platform polyethylene insert.  Discharge Diagnoses: Patient Active Problem List   Diagnosis Date Noted  . Total knee replacement status 09/05/2020  . Airway hyperreactivity 06/03/2020  . Arthritis of knee, degenerative 06/03/2020  . Arthritis of shoulder region, degenerative 06/03/2020  . Clinical depression 06/03/2020  . H/O acute myocardial infarction 06/03/2020  . HBV (hepatitis B virus) infection 06/03/2020  . Heart disease 06/03/2020  . Hepatitis 06/03/2020  . 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 Nocturnal leg cramps 05/26/2016  . Vitamin D deficiency 05/26/2016  . Chronic knee pain 05/26/2016  . Current tobacco use 11/24/2014  . SOB (shortness of breath) 11/24/2014  . Asthma, moderate persistent 11/24/2014  . Substance abuse in remission (HCC) 11/24/2014  . Bilateral leg edema 11/24/2014    Past Medical History:  Diagnosis Date  . Arthritis   .  Asthma   . Coronary artery disease   . Depression    past  . Hepatitis    C  . MI (myocardial infarction) [facility])    age in her mid 45's  . Vitamin D deficiency      Transfusion: n/a   Consultants (if any):   Discharged Condition: Improved  Hospital Course: Connie Moreno is an 59 y.o. female who was admitted 09/05/2020 with a diagnosis of right knee osteoarthritis and went to the operating room on 09/05/2020 and underwent right total knee arthroplasty. The patient received perioperative antibiotics for prophylaxis (see below). The patient tolerated the procedure well and was transported to PACU in stable condition. After meeting PACU criteria, the patient was subsequently transferred to the Orthopaedics/Rehabilitation unit.   The patient received DVT prophylaxis in the form of early mobilization, Lovenox, Foot Pumps and TED hose. A sacral pad had been placed and heels were elevated off of the bed with rolled towels in order to protect skin integrity. Foley catheter was discontinued on postoperative day #0. Wound drains were discontinued on postoperative day #1. The surgical incision was healing well without signs of infection.  Physical therapy was initiated postoperatively for transfers, gait training, and strengthening. Occupational therapy was initiated for activities of daily living and evaluation for assisted devices. Rehabilitation goals were reviewed in detail with the patient. The patient made steady progress with physical therapy and physical therapy recommended discharge to Home.   The patient achieved the preliminary goals of this hospitalization and was felt to be medically and orthopaedically appropriate for discharge.  She was given perioperative antibiotics:  Anti-infectives (From admission, onward)   Start     Dose/Rate Route Frequency Ordered Stop   09/05/20 1900  ceFAZolin (ANCEF) IVPB 2g/100 mL premix  2 g 200 mL/hr over 30 Minutes Intravenous Every 6 hours  09/05/20 1758 09/06/20 0255   09/05/20 1034  ceFAZolin (ANCEF) 2-4 GM/100ML-% IVPB       Note to Pharmacy: Brain Hilts   : cabinet override      09/05/20 1034 09/05/20 2244   09/05/20 0600  ceFAZolin (ANCEF) IVPB 2g/100 mL premix  Status:  Discontinued        2 g 200 mL/hr over 30 Minutes Intravenous On call to O.R. 09/04/20 2238 09/05/20 1026    .  Recent vital signs:  Vitals:   09/06/20 1515 09/06/20 1700  BP: (!) 139/121 (!) 157/57  Pulse: 89   Resp: 18   Temp: 98.8 F (37.1 C)   SpO2: 100%     Recent laboratory studies:  No results for input(s): WBC, HGB, HCT, PLT, K, CL, CO2, BUN, CREATININE, GLUCOSE, CALCIUM, LABPT, INR in the last 72 hours.  Diagnostic Studies: DG Knee Right Port  Result Date: 09/05/2020 CLINICAL DATA:  Post right knee replacement EXAM: PORTABLE RIGHT KNEE - 1-2 VIEW COMPARISON:  03/10/2020 FINDINGS: Changes of right knee replacement. Soft tissue drains are in place. No hardware complicating feature. IMPRESSION: Right knee replacement.  No visible complicating feature. Electronically Signed   By: Charlett Nose M.D.   On: 09/05/2020 17:35    Discharge Medications:   Allergies as of 09/06/2020   No Known Allergies     Medication List    STOP taking these medications   meloxicam 15 MG tablet Commonly known as: MOBIC   naproxen 500 MG tablet Commonly known as: Naprosyn     TAKE these medications   albuterol 108 (90 Base) MCG/ACT inhaler Commonly known as: VENTOLIN HFA Inhale 2 puffs into the lungs every 6 (six) hours as needed for wheezing or shortness of breath.   budesonide-formoterol 80-4.5 MCG/ACT inhaler Commonly known as: SYMBICORT Inhale 2 puffs into the lungs 2 (two) times daily.   celecoxib 200 MG capsule Commonly known as: CELEBREX Take 1 capsule (200 mg total) by mouth 2 (two) times daily.   Cetirizine HCl 10 MG Caps Take 10 mg by mouth. At bedtime or Singular   enoxaparin 40 MG/0.4ML injection Commonly known as:  LOVENOX Inject 0.4 mLs (40 mg total) into the skin daily for 14 days.   montelukast 10 MG tablet Commonly known as: SINGULAIR Take 1 tablet (10 mg total) by mouth at bedtime.   omeprazole 20 MG capsule Commonly known as: PRILOSEC Take 1 capsule (20 mg total) by mouth daily.   oxyCODONE 5 MG immediate release tablet Commonly known as: Oxy IR/ROXICODONE Take 1 tablet (5 mg total) by mouth every 4 (four) hours as needed for moderate pain (pain score 4-6).   traMADol 50 MG tablet Commonly known as: ULTRAM Take 1 tablet (50 mg total) by mouth every 4 (four) hours as needed for moderate pain.   Vitamin D 50 MCG (2000 UT) Caps Take 2,000 Units by mouth daily.            Durable Medical Equipment  (From admission, onward)         Start     Ordered   09/05/20 1759  DME Walker rolling  Once       Question:  Patient needs a walker to treat with the following condition  Answer:  Total knee replacement status   09/05/20 1758   09/05/20 1759  DME Bedside commode  Once       Question:  Patient needs  a bedside commode to treat with the following condition  Answer:  Total knee replacement status   09/05/20 1758          Disposition: home with home health PT     Follow-up Information    My[facility] On 09/20/2020.   Specialty: Orthopedic Surgery Why: at 9:45am Contact information: [address] [facility] [place] [address] [phone]        Donato Heinz, MD On 10/18/2020.   Specialty: Orthopedic Surgery Why: at 2:15pm Contact information: [address] [facility] [place] [address] [phone]                Lasandra Beech, PA-C 09/06/2020, 6:22 PM

## 2020-09-07 ENCOUNTER — Other Ambulatory Visit: Payer: Self-pay

## 2020-09-07 ENCOUNTER — Emergency Department
Admission: EM | Admit: 2020-09-07 | Discharge: 2020-09-07 | Disposition: A | Discharge status: Home / Self Care | Payer: Medicaid Other | Attending: Emergency Medicine | Admitting: Emergency Medicine

## 2020-09-07 DIAGNOSIS — F17210 Nicotine dependence, cigarettes, uncomplicated: Secondary | ICD-10-CM | POA: Diagnosis not present

## 2020-09-07 DIAGNOSIS — G8918 Other acute postprocedural pain: Secondary | ICD-10-CM | POA: Diagnosis not present

## 2020-09-07 DIAGNOSIS — Z96651 Presence of right artificial knee joint: Secondary | ICD-10-CM | POA: Insufficient documentation

## 2020-09-07 DIAGNOSIS — I2510 Atherosclerotic heart disease of native coronary artery without angina pectoris: Secondary | ICD-10-CM | POA: Insufficient documentation

## 2020-09-07 DIAGNOSIS — J45909 Unspecified asthma, uncomplicated: Secondary | ICD-10-CM | POA: Insufficient documentation

## 2020-09-07 DIAGNOSIS — Z9889 Other specified postprocedural states: Secondary | ICD-10-CM

## 2020-09-07 LAB — BPAM RBC
Blood Product Expiration Date: 202112172359
Blood Product Expiration Date: 202112172359
Unit Type and Rh: 5100
Unit Type and Rh: 5100

## 2020-09-07 LAB — TYPE AND SCREEN
ABO/RH(D): B POS
ABO/RH(D): B POS
Antibody Screen: POSITIVE
Antibody Screen: NEGATIVE
Unit division: 0
Unit division: 0

## 2020-09-07 LAB — BASIC METABOLIC PANEL WITH GFR
Anion gap: 9 (ref 5–15)
BUN: 12 mg/dL (ref 6–20)
CO2: 24 mmol/L (ref 22–32)
Calcium: 9 mg/dL (ref 8.9–10.3)
Chloride: 106 mmol/L (ref 98–111)
Creatinine, Ser: 0.82 mg/dL (ref 0.44–1.00)
GFR, Estimated: >60 mL/min
Glucose, Bld: 101 mg/dL — ABNORMAL HIGH (ref 70–99)
Potassium: 3.9 mmol/L (ref 3.5–5.1)
Sodium: 139 mmol/L (ref 135–145)

## 2020-09-07 LAB — CBC
HCT: 32.8 % — ABNORMAL LOW (ref 36.0–46.0)
Hemoglobin: 11 g/dL — ABNORMAL LOW (ref 12.0–15.0)
MCH: 32.3 pg (ref 26.0–34.0)
MCHC: 33.5 g/dL (ref 30.0–36.0)
MCV: 96.2 fL (ref 80.0–100.0)
Platelets: 135 10*3/uL — ABNORMAL LOW (ref 150–400)
RBC: 3.41 MIL/uL — ABNORMAL LOW (ref 3.87–5.11)
RDW: 12.7 % (ref 11.5–15.5)
WBC: 5.8 10*3/uL (ref 4.0–10.5)
nRBC: 0 % (ref 0.0–0.2)

## 2020-09-07 LAB — CK: Total CK: 233 U/L (ref 38–234)

## 2020-09-07 MED ORDER — MORPHINE SULFATE (PF) 4 MG/ML IV SOLN
INTRAVENOUS | Status: AC
  Filled 2020-09-07: qty 1

## 2020-09-07 MED ORDER — OXYCODONE-ACETAMINOPHEN 5-325 MG PO TABS
2.0000 | ORAL_TABLET | Freq: Once | ORAL | Status: AC
  Administered 2020-09-07: 2 via ORAL
  Filled 2020-09-07: qty 2

## 2020-09-07 MED ORDER — ONDANSETRON HCL 4 MG/2ML IJ SOLN
4.0000 mg | Freq: Once | INTRAMUSCULAR | Status: AC
  Administered 2020-09-07: 4 mg via INTRAVENOUS

## 2020-09-07 MED ORDER — MORPHINE SULFATE (PF) 2 MG/ML IV SOLN
INTRAVENOUS | Status: AC
  Filled 2020-09-07: qty 1

## 2020-09-07 MED ORDER — ONDANSETRON HCL 4 MG/2ML IJ SOLN
INTRAMUSCULAR | Status: AC
  Filled 2020-09-07: qty 2

## 2020-09-07 MED ORDER — MORPHINE SULFATE (PF) 4 MG/ML IV SOLN
6.0000 mg | Freq: Once | INTRAVENOUS | Status: AC
  Administered 2020-09-07: 6 mg via INTRAVENOUS

## 2020-09-07 NOTE — ED Notes (Signed)
Pt called out using call bell needing to use bed pan. Chux and bed pan placed under pt. Pt given wipes to cleanse perineum following urination. Bed pan removed from under pt. Pt given warm blankets. Denies further needs at this time.

## 2020-09-07 NOTE — ED Triage Notes (Signed)
Pt from home via [facility]. Pt had right knee replacement at [facility] Wednesday. Pt woke up this morning with blood soaked bandages and excrutiating pain. Pt has not been taking pain medication due to being unable to pick it up from pharmacy. Pt writhing in stretcher at this time.

## 2020-09-07 NOTE — Telephone Encounter (Signed)
I called the patient and informed her that she need to schedule a f/u appt to address her Hepatitis B. She was currently at Dr. Ernest Pine office f/u from her recent surgery. The patient stated that she will give [place] a call back and schedule a f/u appt.

## 2020-09-07 NOTE — ED Notes (Addendum)
Surgeon, PA, and this RN at bedside.

## 2020-09-07 NOTE — Telephone Encounter (Signed)
Patient has not been seen at our office since mid 2020. >1 year. Previously followed by PCP Wilhelmina Mcardle, AGPCNP-BC here.  Patient would need an office visit for evaluation and Hepatitis lab testing, and then likely referral to GI specialist for treatment.  Saralyn Pilar, DO [facility] 09/07/2020, 1:16 PM

## 2020-09-07 NOTE — Discharge Instructions (Addendum)
1. Do not begin Lovenox injections until tomorrow, 09/09/20.  2. Instruct PT to call office regarding instructions for therapy this weekend. This includes a decreased focus on ROM (flexion), with increased focus on ambulation, quad sets, and maintaining full extension using Bone Foam. 3. Knee immobilizer is not required at this time, but if she begins to experience increased pain she may wear it for comfort. 4. Patient will be contacted by someone from the office this weekend to assess status of dressing and regarding follow-up next week.

## 2020-09-07 NOTE — ED Provider Notes (Signed)
-----------------------------------------   9:02 AM on 09/07/2020 -----------------------------------------  Orthopedic/Dr. Ernest Pine have seen and evaluated the patient.  Patient is safe for discharge home from orthopedic standpoint.  Patient's work-up otherwise is reassuring.  We will discharge the patient home with orthopedic follow-up.   Minna Antis, MD 09/07/20 [phone]

## 2020-09-07 NOTE — Consult Note (Signed)
Patient is a newly discharged R TKA. Patient underwent TKA on 09/05/20 and was discharged 09/06/20 after meeting all therapy requirements. Patient woke up with her dressing saturated with blood and in severe pain. She did not pick up her post-op pain medications following surgery and had not had any pain medications since discharge. She called the on-call service and when the provider called back approximately 30 minutes later the patient stated that she had no pain medications and that she could not tolerate the pain until the office opened. She had called EMS and was taken to the ED. In the ED, dressing was noted to be saturated with blood but not leaking from the dressing. She had been given morphine at that time.  Exam: 59 yr old female in no acute distress but significant pain. Extremity: patient in a knee immobilizer; following removal of knee immobilizer, honeycomb dressing was saturated with sanguinous drainage. Mini compression dressing also saturated. All drainage contained within the dressings. Incision: staples intact, one spot of slow, persistent drainage noted around the midpoint of the incision. Patient able to dorsiflex/plantarflex the ankle, n/v intact to RLE.  Assessment/Plan: -s/p R TKA 09/05/20 -persistent post-op drainage from hematoma Honeycomb dressing and mini compression dressing were removed. Yetta Barre' compression dressing was applied, with Xeroform, then 4x4 gauze, then ABDs, Kerlix wrap, and Webb roll applied before being secured with ACE wrap.  Patient instructed not to begin Lovenox injections until tomorrow.   -post-operative pain Patient was given morphine 6 mg IV prior to arrival by myself. Verbal order given for an additional 10 mg Percocet @ 7 AM. Patient instructed to pick up her pain medication from the pharmacy and that she may take another oxy around 11 AM. Instructed to inform therapist to call the office to discuss directions for therapy.   Discharge when  cleared by ED provider.   Follow-up will be determined based on patient status when she is contacted by myself tomorrow. If necessary, she will follow-up in the walk-in clinic for evaluation by myself.   Baldwin Jamaica, PA-C

## 2020-09-07 NOTE — ED Provider Notes (Signed)
[facility] Emergency Department Provider Note   ____________________________________________   First MD Initiated Contact with Patient 09/07/20 0530     (approximate)  I have reviewed the triage vital signs and the nursing notes.   HISTORY  Chief Complaint Post-op Problem  EM caveat: Acuity, concern for acute arterial bleeding, and severe pain.  Some limitation in history as the patient is writhing in severe pain  HPI Connie Moreno is a 59 y.o. female here for evaluation of severe right knee pain and bleeding  Patient reports she went to bed fine, she woke up at about 230 or 3 this morning with severe pain swelling and noticed bleeding through her bandage on the right knee.  She had a total knee replacement done 2 days ago and was discharged yesterday  No fevers or chills.  No chest pain no trouble breathing.  Reports the pain is severe 10 out of 10 located in her right knee.  Denies numbness tingling or tingling in the right foot   Past Medical History:  Diagnosis Date  . Arthritis   . Asthma   . Coronary artery disease   . Depression    past  . Hepatitis    C  . MI (myocardial infarction) [facility])    age in her mid 54's  . Vitamin D deficiency     Patient Active Problem List   Diagnosis Date Noted  . Total knee replacement status 09/05/2020  . Airway hyperreactivity 06/03/2020  . Arthritis of knee, degenerative 06/03/2020  . Arthritis of shoulder region, degenerative 06/03/2020  . Clinical depression 06/03/2020  . H/O acute myocardial infarction 06/03/2020  . HBV (hepatitis B virus) infection 06/03/2020  . Heart disease 06/03/2020  . Hepatitis 06/03/2020  . 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 Nocturnal leg cramps 05/26/2016  . Vitamin D deficiency 05/26/2016  . Chronic knee pain 05/26/2016  . Current tobacco use 11/24/2014  . SOB (shortness of breath) 11/24/2014  . Asthma, moderate persistent 11/24/2014    . Substance abuse in remission (HCC) 11/24/2014  . Bilateral leg edema 11/24/2014    Past Surgical History:  Procedure Laterality Date  . ANKLE SURGERY     metal plate and screws in right ankle.   Marland Kitchen KNEE ARTHROPLASTY Right 09/05/2020   Procedure: COMPUTER ASSISTED TOTAL KNEE ARTHROPLASTY;  Surgeon: Donato Heinz, MD;  Location: [facility] ORS;  Service: Orthopedics;  Laterality: Right;  . KNEE SURGERY     bilateral knee surgery   . TUBAL LIGATION      Prior to Admission medications   Medication Sig Start Date End Date Taking? Authorizing Provider  albuterol (VENTOLIN HFA) 108 (90 Base) MCG/ACT inhaler Inhale 2 puffs into the lungs every 6 (six) hours as needed for wheezing or shortness of breath. 05/13/19   Galen Manila, NP  budesonide-formoterol (SYMBICORT) 80-4.5 MCG/ACT inhaler Inhale 2 puffs into the lungs 2 (two) times daily. 05/13/19   Galen Manila, NP  celecoxib (CELEBREX) 200 MG capsule Take 1 capsule (200 mg total) by mouth 2 (two) times daily. 09/06/20   Madelyn Flavors, PA-C  Cetirizine HCl 10 MG CAPS Take 10 mg by mouth. At bedtime or Singular    [provider]  Cholecalciferol (VITAMIN D) 2000 units CAPS Take 2,000 Units by mouth daily.    [provider]  enoxaparin (LOVENOX) 40 MG/0.4ML injection Inject 0.4 mLs (40 mg total) into the skin daily for 14 days. 09/06/20 09/20/20  Katrinka Blazing,  Latrelle Dodrill, PA-C  montelukast (SINGULAIR) 10 MG tablet Take 1 tablet (10 mg total) by mouth at bedtime. 05/13/19   Galen Manila, NP  omeprazole (PRILOSEC) 20 MG capsule Take 1 capsule (20 mg total) by mouth daily. Patient not taking: Reported on 08/28/2020 05/13/19   Galen Manila, NP  oxyCODONE (OXY IR/ROXICODONE) 5 MG immediate release tablet Take 1 tablet (5 mg total) by mouth every 4 (four) hours as needed for moderate pain (pain score 4-6). 09/06/20   Madelyn Flavors, PA-C  traMADol (ULTRAM) 50 MG tablet Take 1 tablet (50 mg total) by mouth  every 4 (four) hours as needed for moderate pain. 09/06/20   Madelyn Flavors, PA-C    Allergies Patient has no known allergies.  Family History  Problem Relation Age of Onset  . Hypertension Brother   . Hypertension Brother     Social History Social History   Tobacco Use  . Smoking status: Current Every Day Smoker    Packs/day: 0.50    Years: 39.00    Pack years: 19.50    Types: Cigarettes  . Smokeless tobacco: Never Used  . Tobacco comment: 3 cigarettes/day  Vaping Use  . Vaping Use: Never used  Substance Use Topics  . Alcohol use: Yes    Alcohol/week: 40.0 standard drinks    Types: 40 Cans of beer per week    Comment: Drinks a 40 oz. daily or every other day.  . Drug use: Not Currently    Types: Marijuana, Cocaine    Comment: Used cocaine and marijuana for 6 years. Has quit the use of these drugs.     Review of Systems EM caveat  Constitutional: No fever/chills Cardiovascular: Denies chest pain. Respiratory: Denies shortness of breath. Gastrointestinal: No abdominal pain.   Musculoskeletal: Negative for back pain. Skin: Negative for rash. Neurological: Negative for headaches, areas of focal weakness or numbness.    ____________________________________________   PHYSICAL EXAM:  VITAL SIGNS: ED Triage Vitals  Enc Vitals Group     BP 09/07/20 0523 (!) 157/113     Pulse Rate 09/07/20 0523 93     Resp 09/07/20 0523 20     Temp 09/07/20 0523 98.6 F (37 C)     Temp Source 09/07/20 0523 Oral     SpO2 09/07/20 0523 100 %     Weight 09/07/20 0524 141 lb (64 kg)     Height 09/07/20 0524 5\' 6"  (1.676 m)     Head Circumference --      Peak Flow --      Pain Score 09/07/20 0523 10     Pain Loc --      Pain Edu? --      Excl. in GC? --     Constitutional: Alert and oriented.  Patient is rolling back and forth in the bed writhing in severe pain Eyes: Conjunctivae are normal. Head: Atraumatic. Nose: No congestion/rhinnorhea. Mouth/Throat: Mucous  membranes are moist. Neck: No stridor.  Cardiovascular: Normal rate, regular rhythm. Grossly normal heart sounds.  Good peripheral circulation.  Strong palpable dorsalis pedis and posterior tibial pulses with normal capillary refill in the right foot. Respiratory: Normal respiratory effort.  No retractions. Lungs CTAB. Gastrointestinal: Soft and nontender. No distention. Musculoskeletal: Left lower extremity atraumatic.  Right lower extremity has honeycomb dressing that is soaked through with red blood, there is also an area over the right lateral knee joint that also has red blood soaking into dressing.  It has not exceeded the  margins of the dressing, but the knee itself feels tender extremely tender to touch and swollen compared to the left. Neurologic:  Normal speech and language. No gross focal neurologic deficits are appreciated.  Skin:  Skin is warm, dry and intact. No rash noted. Psychiatric: Mood and affect are normal. Speech and behavior are normal.  ____________________________________________   LABS (all labs ordered are listed, but only abnormal results are displayed)  Labs Reviewed  CBC - Abnormal; Notable for the following components:      Result Value   RBC 3.41 (*)    Hemoglobin 11.0 (*)    HCT 32.8 (*)    Platelets 135 (*)    All other components within normal limits  BASIC METABOLIC PANEL - Abnormal; Notable for the following components:   Glucose, Bld 101 (*)    All other components within normal limits  CK  PROTIME-INR  APTT  ETHANOL  TYPE AND SCREEN   ____________________________________________  EKG   ____________________________________________  RADIOLOGY   ____________________________________________   PROCEDURES  Procedure(s) performed: None  Procedures  Critical Care performed: No  ____________________________________________   INITIAL IMPRESSION / ASSESSMENT AND PLAN / ED COURSE  Pertinent labs & imaging results that were available  during my care of the patient were reviewed by me and considered in my medical decision making (see chart for details).   Patient presents for sudden onset severe pain involving right knee postoperative site with some bleeding noted to her dressings, no obvious acute arterial hemorrhage, but certainly evidence of bleeding and severe pain surrounding the right knee.  Patient appears to be severe pain in extremities.  Neurovascularly intact distally.  Denies any other concerns.  Patient reports she was doing very well, no issues up until the point that she woke up suddenly with pain and blood over her bandages this morning  Clinical Course as of Sep 07 720  Fri Sep 07, 2020  0867 Paged Dr. Ernest Pine to request stat consult for concern of possible arterial bleeding/hematoma at surgical site and severe pain   [MQ]  0540 Dr. Ernest Pine called, Baldwin Jamaica coming to ER now to evaluate.    [MQ]  0604 Patient reports pain much improved.  She is alert, appears much more comfortable.  Awaiting orthopedics consult   [MQ]  [phone] Been dismissed from orthopedics currently at bedside   [MQ]  0644 Patient's pain much improved resting comfortably   [MQ]    Clinical Course User Index [MQ] Sharyn Creamer, MD   ----------------------------------------- 7:20 AM on 09/07/2020 -----------------------------------------  Ongoing care and disposition assigned to Dr. Lenard Lance.  Currently patient being consulted on by orthopedics, Dr. Ernest Pine and Baldwin Jamaica currently at the bedside.  Recommend follow-up on their consult recommendations  ____________________________________________   FINAL CLINICAL IMPRESSION(S) / ED DIAGNOSES  Final diagnoses:  Post-operative pain  Post-operative state        Note:  This document was prepared using Dragon voice recognition software and may include unintentional dictation errors       Sharyn Creamer, MD 09/07/20 [phone]

## 2020-09-07 NOTE — ED Notes (Signed)
Knee immobilizer placed around right knee per Quale MD.

## 2021-12-11 ENCOUNTER — Ambulatory Visit (LOCAL_COMMUNITY_HEALTH_CENTER): Payer: Medicaid Other

## 2021-12-11 ENCOUNTER — Other Ambulatory Visit: Payer: Self-pay

## 2021-12-11 VITALS — BP 117/65 | Ht 66.0 in | Wt 130.0 lb

## 2021-12-11 DIAGNOSIS — Z3202 Encounter for pregnancy test, result negative: Secondary | ICD-10-CM | POA: Diagnosis not present

## 2021-12-11 LAB — PREGNANCY, URINE: Preg Test, Ur: NEGATIVE
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
## 2023-07-04 ENCOUNTER — Emergency Department: Payer: MEDICAID

## 2023-07-04 ENCOUNTER — Emergency Department
Admission: EM | Admit: 2023-07-04 | Discharge: 2023-07-04 | Disposition: A | Discharge status: Home / Self Care | Payer: MEDICAID | Attending: Student in an Organized Health Care Education/Training Program | Admitting: Student in an Organized Health Care Education/Training Program

## 2023-07-04 DIAGNOSIS — S0083XA Contusion of other part of head, initial encounter: Secondary | ICD-10-CM | POA: Diagnosis not present

## 2023-07-04 DIAGNOSIS — I2510 Atherosclerotic heart disease of native coronary artery without angina pectoris: Secondary | ICD-10-CM | POA: Insufficient documentation

## 2023-07-04 DIAGNOSIS — J45909 Unspecified asthma, uncomplicated: Secondary | ICD-10-CM | POA: Insufficient documentation

## 2023-07-04 DIAGNOSIS — R519 Headache, unspecified: Secondary | ICD-10-CM | POA: Diagnosis present

## 2023-07-04 MED ORDER — IBUPROFEN 800 MG PO TABS: 800.0000 mg | ORAL_TABLET | Freq: Three times a day (TID) | ORAL | 0 refills | Status: DC | PRN

## 2023-07-04 MED ORDER — HYDROCODONE-ACETAMINOPHEN 5-325 MG PO TABS
1.0000 | ORAL_TABLET | Freq: Once | ORAL | Status: AC
  Administered 2023-07-04: 1 via ORAL
  Filled 2023-07-04: qty 1

## 2023-07-04 MED ORDER — CYCLOBENZAPRINE HCL 5 MG PO TABS: 5.0000 mg | ORAL_TABLET | Freq: Three times a day (TID) | ORAL | 0 refills | Status: DC | PRN
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
## 2023-09-14 ENCOUNTER — Encounter: Payer: Self-pay | Admitting: Medical Oncology

## 2023-09-14 ENCOUNTER — Emergency Department
Admission: EM | Admit: 2023-09-14 | Discharge: 2023-09-14 | Disposition: A | Discharge status: Home / Self Care | Payer: MEDICAID | Attending: Emergency Medicine | Admitting: Emergency Medicine

## 2023-09-14 ENCOUNTER — Other Ambulatory Visit: Payer: Self-pay

## 2023-09-14 ENCOUNTER — Emergency Department: Payer: MEDICAID

## 2023-09-14 DIAGNOSIS — M7918 Myalgia, other site: Secondary | ICD-10-CM | POA: Diagnosis not present

## 2023-09-14 DIAGNOSIS — Y92410 Unspecified street and highway as the place of occurrence of the external cause: Secondary | ICD-10-CM | POA: Insufficient documentation

## 2023-09-14 DIAGNOSIS — R202 Paresthesia of skin: Secondary | ICD-10-CM | POA: Insufficient documentation

## 2023-09-14 DIAGNOSIS — M542 Cervicalgia: Secondary | ICD-10-CM | POA: Diagnosis present

## 2023-09-14 DIAGNOSIS — S161XXA Strain of muscle, fascia and tendon at neck level, initial encounter: Secondary | ICD-10-CM | POA: Insufficient documentation

## 2023-09-14 DIAGNOSIS — J45909 Unspecified asthma, uncomplicated: Secondary | ICD-10-CM | POA: Insufficient documentation

## 2023-09-14 MED ORDER — CYCLOBENZAPRINE HCL 10 MG PO TABS
10.0000 mg | ORAL_TABLET | Freq: Once | ORAL | Status: AC
  Administered 2023-09-14: 10 mg via ORAL
  Filled 2023-09-14: qty 1

## 2023-09-14 MED ORDER — KETOROLAC TROMETHAMINE 15 MG/ML IJ SOLN
15.0000 mg | Freq: Once | INTRAMUSCULAR | Status: AC
  Administered 2023-09-14: 15 mg via INTRAMUSCULAR
  Filled 2023-09-14: qty 1

## 2023-09-14 MED ORDER — MELOXICAM 15 MG PO TABS: 15.0000 mg | ORAL_TABLET | Freq: Every day | ORAL | 0 refills | Status: AC

## 2023-09-14 MED ORDER — BACLOFEN 10 MG PO TABS: 10.0000 mg | ORAL_TABLET | Freq: Three times a day (TID) | ORAL | 0 refills | Status: AC
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
